# Patient Record
Sex: Female | Born: 1955 | Race: White | Hispanic: No | Marital: Married | Smoking: Former smoker
Health system: Southern US, Community
[De-identification: ages and names within clinical notes are randomized; demographics above are authoritative.]

---

## 2013-07-22 DIAGNOSIS — F32A Depression, unspecified: Secondary | ICD-10-CM | POA: Insufficient documentation

## 2013-07-22 DIAGNOSIS — F3342 Major depressive disorder, recurrent, in full remission: Secondary | ICD-10-CM | POA: Insufficient documentation

## 2013-07-22 DIAGNOSIS — E039 Hypothyroidism, unspecified: Secondary | ICD-10-CM | POA: Insufficient documentation

## 2013-07-23 DIAGNOSIS — N3281 Overactive bladder: Secondary | ICD-10-CM | POA: Insufficient documentation

## 2015-07-30 DIAGNOSIS — E7800 Pure hypercholesterolemia, unspecified: Secondary | ICD-10-CM | POA: Insufficient documentation

## 2016-12-15 DIAGNOSIS — E559 Vitamin D deficiency, unspecified: Secondary | ICD-10-CM | POA: Insufficient documentation

## 2020-08-30 ENCOUNTER — Ambulatory Visit (HOSPITAL_BASED_OUTPATIENT_CLINIC_OR_DEPARTMENT_OTHER)
Admission: RE | Admit: 2020-08-30 | Discharge: 2020-08-30 | Disposition: A | Discharge status: Home / Self Care | Payer: Managed Care, Other (non HMO) | Source: Ambulatory Visit | Attending: Family Medicine | Admitting: Family Medicine

## 2020-08-30 ENCOUNTER — Ambulatory Visit (INDEPENDENT_AMBULATORY_CARE_PROVIDER_SITE_OTHER): Payer: Managed Care, Other (non HMO) | Admitting: Family Medicine

## 2020-08-30 ENCOUNTER — Encounter: Payer: Self-pay | Admitting: Family Medicine

## 2020-08-30 ENCOUNTER — Other Ambulatory Visit: Payer: Self-pay

## 2020-08-30 VITALS — BP 125/81 | HR 79 | Ht 64.0 in | Wt 135.0 lb

## 2020-08-30 DIAGNOSIS — M545 Low back pain, unspecified: Secondary | ICD-10-CM | POA: Insufficient documentation

## 2020-08-30 MED ORDER — PREDNISONE 5 MG PO TABS: ORAL_TABLET | ORAL | 0 refills | Status: DC

## 2020-08-30 NOTE — Patient Instructions (Signed)
Nice to meet you Please try heat  Please try the prednisone  Please try the duexis after the prednisone  Please try the stretches  I will call with the results from today   Please send me a message in [place] with any questions or updates.  Please see me back in 1-2 weeks.   --Dr. Raeford Razor

## 2020-08-30 NOTE — Assessment & Plan Note (Signed)
Pain seems to be related to spasm.  She has a history of fusion from several years ago.  May have a component of SI contribution. -Counseled on home exercise therapy and supportive care. -Prednisone. -Duexis samples. -X-ray -May need to consider SI joint injection physical therapy.

## 2020-08-30 NOTE — Progress Notes (Signed)
Medication Samples have been provided to the patient.  Drug name: Duexis       Strength: 800mg /26.6mg         Qty: 1 box  LOT: [id number]  Exp.Date: 02/2021  Dosing instructions: take 1 tablet by mouth three (3) times a day.  The patient has been instructed regarding the correct time, dose, and frequency of taking this medication, including desired effects and most common side effects.   Sherrie George, [place] 12:01 PM 08/30/2020

## 2020-08-30 NOTE — Progress Notes (Signed)
Brenda Stanley - 64 y.o. female MRN [id number]  Date of birth: Aug 06, 1956  SUBJECTIVE:  Including CC & ROS.  Chief Complaint  Patient presents with  . Back Pain    left-sided low back    Brenda Stanley is a 64 y.o. female that is presenting with acute low back pain.  The pain started yesterday after she sneezed.  Now she is having trouble with getting up from a seated position.  Having some pain in the left gluteal region as well as some pain in the lateral hip.  Has a history of fusion several years ago.  No new or different exposures.  No weakness.  No incontinence.   Review of Systems See HPI   HISTORY: Past Medical, Surgical, Social, and Family History Reviewed & Updated per EMR.   Pertinent Historical Findings include:  History reviewed. No pertinent past medical history.  History reviewed. No pertinent surgical history.  History reviewed. No pertinent family history.  Social History   Socioeconomic History  . Marital status: Married    Spouse name: Not on file  . Number of children: Not on file  . Years of education: Not on file  . Highest education level: Not on file  Occupational History  . Not on file  Tobacco Use  . Smoking status: Never Smoker  . Smokeless tobacco: Never Used  Substance and Sexual Activity  . Alcohol use: Not on file  . Drug use: Not on file  . Sexual activity: Not on file  Other Topics Concern  . Not on file  Social History Narrative  . Not on file   Social Determinants of Health   Financial Resource Strain:   . Difficulty of Paying Living Expenses: Not on file  Food Insecurity:   . Worried About Charity fundraiser in the Last Year: Not on file  . Ran Out of Food in the Last Year: Not on file  Transportation Needs:   . Lack of Transportation (Medical): Not on file  . Lack of Transportation (Non-Medical): Not on file  Physical Activity:   . Days of Exercise per Week: Not on file  . Minutes of Exercise per Session: Not on file  Stress:    . Feeling of Stress : Not on file  Social Connections:   . Frequency of Communication with Friends and Family: Not on file  . Frequency of Social Gatherings with Friends and Family: Not on file  . Attends Religious Services: Not on file  . Active Member of Clubs or Organizations: Not on file  . Attends Archivist Meetings: Not on file  . Marital Status: Not on file  Intimate Partner Violence:   . Fear of Current or Ex-Partner: Not on file  . Emotionally Abused: Not on file  . Physically Abused: Not on file  . Sexually Abused: Not on file     PHYSICAL EXAM:  VS: BP 125/81   Pulse 79   Ht 5\' 4"  (1.626 m)   Wt 135 lb (61.2 kg)   BMI 23.17 kg/m  Physical Exam Gen: NAD, alert, cooperative with exam, well-appearing MSK:  Back: Normal flexion. Pain with extension. Normal abduction. Normal internal and external rotation of the left hip. Normal hip flexion. Some pain with straight leg raise. Neurovascularly intact     ASSESSMENT & PLAN:   Acute left-sided low back pain without sciatica Pain seems to be related to spasm.  She has a history of fusion from several years ago.  May have  a component of SI contribution. -Counseled on home exercise therapy and supportive care. -Prednisone. -Duexis samples. -X-ray -May need to consider SI joint injection physical therapy.

## 2020-08-31 ENCOUNTER — Telehealth: Payer: Self-pay | Admitting: Family Medicine

## 2020-08-31 DIAGNOSIS — M545 Low back pain, unspecified: Secondary | ICD-10-CM

## 2020-08-31 NOTE — Telephone Encounter (Signed)
Patient called back, spoke w/ CMA  xray results shared but she now has some addt'l questions she wants to go over w/Dr.Schmitz.  --Forwarding message to provider Patient wishes him to call her.  --glh

## 2020-08-31 NOTE — Telephone Encounter (Signed)
Left VM for patient. If she calls back please have her speak with a nurse/CMA and inform that her xray shows the degenerative changes and surgical hardware that is broken. This appears more chronic than acute. We can consider a CT to better evaluate all of these changes if the pain isn't improving.   If any questions then please take the best time and phone number to call and I will try to call her back.   Rosemarie Ax, MD [facility] 08/31/2020, 8:34 AM

## 2020-08-31 NOTE — Telephone Encounter (Signed)
Spoke with patient about xray findings. Will proceed with CT lumbar spine to evaluate fractured hardware.   Rosemarie Ax, MD [facility] 08/31/2020, 1:37 PM

## 2020-08-31 NOTE — Telephone Encounter (Signed)
Spoke to patient and gave result information as provided by the physician. 

## 2020-08-31 NOTE — Telephone Encounter (Signed)
Patient returned provider's call for results-- forwarding message to med asst to contact pt w/ Provider review  --glh.

## 2020-09-01 ENCOUNTER — Telehealth: Payer: Self-pay | Admitting: Family Medicine

## 2020-09-01 NOTE — Telephone Encounter (Signed)
Received call from [facility] [phone] says pt has called them for a [place] approval for CT Scan (they advised her that they had our med records but review & approval would take  Up to 72hrs-- they also advised her that if provider did a Peer to Peer that approval time could be greatly reduce to up to 24hrs--  Instructions are that provider call [place] @ [phone] as soon as possible to schedule & from there a decision would be rendered.  --Forwarding information to [place].  --glh

## 2020-09-01 NOTE — Telephone Encounter (Signed)
Rcvd notice from [place] that addt'l medical information needed for pre- approval of CT Scan-- faxed 2nd time  OV notes to [phone]

## 2020-09-02 ENCOUNTER — Telehealth: Payer: Self-pay | Admitting: Family Medicine

## 2020-09-02 ENCOUNTER — Other Ambulatory Visit: Payer: Self-pay

## 2020-09-02 ENCOUNTER — Ambulatory Visit (INDEPENDENT_AMBULATORY_CARE_PROVIDER_SITE_OTHER): Payer: Managed Care, Other (non HMO)

## 2020-09-02 DIAGNOSIS — M545 Low back pain, unspecified: Secondary | ICD-10-CM

## 2020-09-02 NOTE — Telephone Encounter (Signed)
Spoke with patient's husband about the status of the CT scan.  Insurance was called and provided updated imaging of the x-ray.  It is still on the evaluation process.  He reports she is having periods of syncope.  Advised to have follow-up with primary care or seek more immediate care if needed.  Rosemarie Ax, MD [facility] 09/02/2020, 9:39 AM

## 2020-09-02 NOTE — Telephone Encounter (Signed)
[employer] rep/Raymond cld frm [phone] says ( pt/Brenda Stanley) & [employer](PAC comp rep/Nicolas all on line regarding precert for CT Scan for patient.--Per them CT Scan not showing as urgent.(although Dr Raeford Razor did so during call w/ [facility] dept) while giving addt'l medical updates) -- Clarification needed for Urgency of CT scan &  Dr. Raeford Razor connected on line w/ all confirmed that scan is necessary/Urgent  & Per Kyung Rudd it will take 4 hrs for precert to go through process, per him an email will be sent to pt-- a faxed Approval will be sent to provider--a CT Scan appt has been held in reserve for pt @ [facility] dept & they states Auth must be rcvd & documented before scan can be performed.  --Patient will be contacted to show up @ scheduled time once Authorization has been received.  --glh

## 2020-09-06 ENCOUNTER — Telehealth (INDEPENDENT_AMBULATORY_CARE_PROVIDER_SITE_OTHER): Payer: Managed Care, Other (non HMO) | Admitting: Family Medicine

## 2020-09-06 ENCOUNTER — Other Ambulatory Visit: Payer: Self-pay

## 2020-09-06 DIAGNOSIS — M545 Low back pain, unspecified: Secondary | ICD-10-CM | POA: Diagnosis not present

## 2020-09-06 DIAGNOSIS — Z981 Arthrodesis status: Secondary | ICD-10-CM

## 2020-09-06 NOTE — Assessment & Plan Note (Signed)
Unclear of when the fracture occurred.  - referral to neurosurgery

## 2020-09-06 NOTE — Assessment & Plan Note (Signed)
She does have degenerative changes of the facet joints as well as a small slip. -Counseled supportive care. -Referral to physical therapy.

## 2020-09-06 NOTE — Progress Notes (Signed)
Virtual Visit via Telephone Note  I connected with Brenda Stanley on 09/06/20 at  2:30 PM EDT by telephone and verified that I am speaking with the correct person using two identifiers.   I discussed the limitations, risks, security and privacy concerns of performing an evaluation and management service by telephone and the availability of in person appointments. I also discussed with the patient that there may be a patient responsible charge related to this service. The patient expressed understanding and agreed to proceed.  Patient: home  Physician: office   History of Present Illness:  Brenda Stanley is a 64 yo F that is following up after a CT of her lumbar spine. Her pain continues and is worse with transitioning from sitting to standing.    Observations/Objective:  Gen: NAD, alert, cooperative with exam,   Assessment and Plan:  History of lumbar fusion:  Unclear of when the fracture occurred.  - referral to neurosurgery  Low back pain: She does have degenerative changes of the facet joints as well as a small slip. -Counseled supportive care. -Referral to physical therapy.  Follow Up Instructions:    I discussed the assessment and treatment plan with the patient. The patient was provided an opportunity to ask questions and all were answered. The patient agreed with the plan and demonstrated an understanding of the instructions.   The patient was advised to call back or seek an in-person evaluation if the symptoms worsen or if the condition fails to improve as anticipated.  Less than 50 % of the visit was conducted by video.  I provided 7 minutes of non-face-to-face time during this encounter.   Clearance Coots, MD

## 2020-09-08 ENCOUNTER — Telehealth: Payer: Self-pay | Admitting: Family Medicine

## 2020-09-08 DIAGNOSIS — Z4889 Encounter for other specified surgical aftercare: Secondary | ICD-10-CM

## 2020-09-08 NOTE — Telephone Encounter (Signed)
Referring physician requesting myelogram of lumbar spine before initial consult. Order placed.   Rosemarie Ax, MD [facility] 09/08/2020, 11:19 AM

## 2020-09-08 NOTE — Telephone Encounter (Signed)
Doneisha from [place] office) [phone] say since pt has metal hardware in back MRI not necessary a CT Myelogram is okay for pt.  --forwarding message to provider for review.  --glh

## 2020-09-13 ENCOUNTER — Telehealth: Payer: Self-pay | Admitting: Family Medicine

## 2020-09-13 NOTE — Telephone Encounter (Signed)
Office clsd Fri 10/22 @ 12 noon-- Pt left msg after clsing inquired status of Ct Scan ---[facility] clsd  10/25 & 26 .  Responded to pt's VM, unaware issue had been addressed by  [facility] Adm per pt & that they are getting her a Ct scheduled --apologized  office closure delayed our response.  glh

## 2020-09-15 ENCOUNTER — Encounter: Payer: Self-pay | Admitting: *Deleted

## 2020-09-15 ENCOUNTER — Telehealth: Payer: Self-pay | Admitting: Family Medicine

## 2020-09-15 NOTE — Progress Notes (Signed)
Dr Alfred Levins [facility] [address], [place] [address] Phone: [phone] Appt is 11/09/20 @ 3pm  Informed their staff that we wanted patient to be sooner if possible. They agreed to put patient on the waiting list to be moved up for a sooner appt if someone cancels their appt. Informed patient that I knew the nurse practioner that works at [employer] and I will contact her to see if she could help expedite the patients appt date. Pt agreed to all the above information.

## 2020-09-15 NOTE — Telephone Encounter (Signed)
FYI that pt's LOV notes & prior Ct Scan report faxed to [place] @ [phone] for authorization of CT scan.  --glh

## 2020-09-15 NOTE — Progress Notes (Signed)
Patient called back after processing that she could not wait until 12/21 to see someone for a referral. She expressed that she is willing to see a surgeon that we recommend, given her condition, since her choice of surgeon (Dr Alfred Levins) appointment is not until December 21st 2021. After talking with Dr Raeford Razor, Dr Lynann Bologna at [facility] is who we felt could see her the soonest and since we have worked with [place] in the past. Patient now has an appointment to see Dr Phylliss Bob on Friday, November 5th at 10:15a. Since she is a new patient, she is to arrive 45 mins early for this appt. Their address is [address] [place] [address]. The office number is [phone] in case she needs to call for any reason. She is to bring her insurance card, wear a mask, and she is the only one allowed in for the appt due to [place] office restrictions. Informed patient of all of the information regarding the appt. Also told patient since [place] is not on Epic, I will fax her records over to the office at  [phone]. Patient in agreement with the above plan.

## 2020-09-20 ENCOUNTER — Ambulatory Visit: Payer: Managed Care, Other (non HMO) | Admitting: Physical Therapy

## 2020-09-28 ENCOUNTER — Other Ambulatory Visit: Payer: Self-pay | Admitting: Family Medicine

## 2020-09-28 ENCOUNTER — Other Ambulatory Visit: Payer: Self-pay | Admitting: Orthopedic Surgery

## 2020-09-28 DIAGNOSIS — M533 Sacrococcygeal disorders, not elsewhere classified: Secondary | ICD-10-CM

## 2020-09-28 DIAGNOSIS — Z4889 Encounter for other specified surgical aftercare: Secondary | ICD-10-CM

## 2020-10-18 ENCOUNTER — Other Ambulatory Visit: Payer: Self-pay

## 2020-10-18 ENCOUNTER — Ambulatory Visit
Admission: RE | Admit: 2020-10-18 | Discharge: 2020-10-18 | Disposition: A | Discharge status: Home / Self Care | Payer: Managed Care, Other (non HMO) | Source: Ambulatory Visit | Attending: Orthopedic Surgery | Admitting: Orthopedic Surgery

## 2020-10-18 DIAGNOSIS — M533 Sacrococcygeal disorders, not elsewhere classified: Secondary | ICD-10-CM

## 2020-10-18 NOTE — Discharge Instructions (Signed)

## 2020-10-20 ENCOUNTER — Ambulatory Visit (INDEPENDENT_AMBULATORY_CARE_PROVIDER_SITE_OTHER): Payer: Managed Care, Other (non HMO) | Admitting: Family Medicine

## 2020-10-20 ENCOUNTER — Encounter: Payer: Self-pay | Admitting: Family Medicine

## 2020-10-20 VITALS — BP 129/64 | HR 72 | Temp 98.1°F | Ht 64.0 in | Wt 139.4 lb

## 2020-10-20 DIAGNOSIS — N3281 Overactive bladder: Secondary | ICD-10-CM

## 2020-10-20 DIAGNOSIS — E278 Other specified disorders of adrenal gland: Secondary | ICD-10-CM

## 2020-10-20 DIAGNOSIS — N2889 Other specified disorders of kidney and ureter: Secondary | ICD-10-CM | POA: Diagnosis not present

## 2020-10-20 DIAGNOSIS — E7800 Pure hypercholesterolemia, unspecified: Secondary | ICD-10-CM

## 2020-10-20 DIAGNOSIS — E039 Hypothyroidism, unspecified: Secondary | ICD-10-CM | POA: Diagnosis not present

## 2020-10-20 DIAGNOSIS — F3341 Major depressive disorder, recurrent, in partial remission: Secondary | ICD-10-CM

## 2020-10-20 MED ORDER — OXYBUTYNIN CHLORIDE 5 MG PO TABS
5.0000 mg | ORAL_TABLET | Freq: Three times a day (TID) | ORAL | 1 refills | Status: DC | PRN
Start: 2020-10-20 — End: 2024-04-04

## 2020-10-20 MED ORDER — FLUOXETINE HCL 40 MG PO CAPS
ORAL_CAPSULE | ORAL | 1 refills | Status: DC
Start: 2020-10-20 — End: 2021-06-29

## 2020-10-20 MED ORDER — LEVOTHYROXINE SODIUM 50 MCG PO TABS
ORAL_TABLET | ORAL | 1 refills | Status: DC
Start: 2020-10-20 — End: 2021-06-21

## 2020-10-20 MED ORDER — ATORVASTATIN CALCIUM 40 MG PO TABS
ORAL_TABLET | ORAL | 1 refills | Status: DC
Start: 2020-10-20 — End: 2021-04-14

## 2020-10-20 NOTE — Assessment & Plan Note (Signed)
Stable with prozac, she will continue this at current strength.  Refill ordered

## 2020-10-20 NOTE — Progress Notes (Addendum)
Brenda Stanley - 64 y.o. female MRN [id number]  Date of birth: 1956/04/21  Subjective Chief Complaint  Patient presents with  . Establish Care  . Medication Refill    HPI Brenda Stanley is a 64 y.o. female here today for initial visit.  She has history of HLD, hypothyroidism, depression and low back pain.  She is seeing Dr. Lynann Bologna for her back pain, recently had SI injection with anesthetic.  She had recent MRI of her lumbar spine as well and noted to have ~4cm R renal mass vs hemorrhagic cyst and ~1cm adrenal nodule.  She has not noticed hematuria or other symptoms.    She has been on stable dose of levothyroxine at 64mcg for several years.  She feels good at current dose.   Cholesterol has been managed with atorvastatin 40mg  daily.  She is tolerating this well without significant side effects.  It has been about 1 year since she had labs.    She takes oxybutynin as needed for OAB symptoms.  This is working well for her without significant side effects.    Depression screen PHQ 2/9 10/20/2020  Decreased Interest 2  Down, Depressed, Hopeless 1  PHQ - 2 Score 3  Altered sleeping 2  Tired, decreased energy 1  Change in appetite 2  Feeling bad or failure about yourself  1  Trouble concentrating 2  Moving slowly or fidgety/restless 0  Suicidal thoughts 0  PHQ-9 Score 11  Difficult doing work/chores Somewhat difficult   GAD 7 : Generalized Anxiety Score 10/20/2020  Nervous, Anxious, on Edge 0  Control/stop worrying 1  Worry too much - different things 1  Trouble relaxing 1  Restless 0  Easily annoyed or irritable 0  Afraid - awful might happen 0  Total GAD 7 Score 3  Anxiety Difficulty Not difficult at all      ROS:  A comprehensive ROS was completed and negative except as noted per HPI  Allergies  Allergen Reactions  . Sulfa Antibiotics   . Tetracyclines & Related   . Codeine Nausea And Vomiting  . Varenicline Other (See Comments)    Bad dreams Bad dreams     History  reviewed. No pertinent past medical history.  History reviewed. No pertinent surgical history.  Social History   Socioeconomic History  . Marital status: Married    Spouse name: Not on file  . Number of children: Not on file  . Years of education: Not on file  . Highest education level: Not on file  Occupational History  . Occupation: Therapist  Tobacco Use  . Smoking status: Former Smoker    Packs/day: 0.75    Years: 30.00    Pack years: 22.50    Types: Cigarettes  . Smokeless tobacco: Never Used  Vaping Use  . Vaping Use: Never used  Substance and Sexual Activity  . Alcohol use: Yes    Alcohol/week: 1.0 - 2.0 standard drink    Types: 1 - 2 Glasses of wine per week  . Drug use: Not on file  . Sexual activity: Yes    Partners: Male  Other Topics Concern  . Not on file  Social History Narrative  . Not on file   Social Determinants of Health   Financial Resource Strain:   . Difficulty of Paying Living Expenses: Not on file  Food Insecurity:   . Worried About Charity fundraiser in the Last Year: Not on file  . Ran Out of Food in the Last Year:  Not on file  Transportation Needs:   . Lack of Transportation (Medical): Not on file  . Lack of Transportation (Non-Medical): Not on file  Physical Activity:   . Days of Exercise per Week: Not on file  . Minutes of Exercise per Session: Not on file  Stress:   . Feeling of Stress : Not on file  Social Connections:   . Frequency of Communication with Friends and Family: Not on file  . Frequency of Social Gatherings with Friends and Family: Not on file  . Attends Religious Services: Not on file  . Active Member of Clubs or Organizations: Not on file  . Attends Archivist Meetings: Not on file  . Marital Status: Not on file    History reviewed. No pertinent family history.  Health Maintenance  Topic Date Due  . Hepatitis C Screening  Never done  . HIV Screening  Never done  . PAP SMEAR-Modifier  Never done   . MAMMOGRAM  Never done  . COLONOSCOPY  Never done  . COVID-19 Vaccine (1) 11/05/2020 (Originally 02/21/1968)  . INFLUENZA VACCINE  02/17/2021 (Originally 06/20/2020)  . TETANUS/TDAP  10/20/2021 (Originally 02/21/1975)     ----------------------------------------------------------------------------------------------------------------------------------------------------------------------------------------------------------------- Physical Exam BP 129/64 (BP Location: Left Arm, Patient Position: Sitting, Cuff Size: Small)   Pulse 72   Temp 98.1 F (36.7 C) (Oral)   Ht 5\' 4"  (1.626 m)   Wt 139 lb 6.4 oz (63.2 kg)   SpO2 96%   BMI 23.93 kg/m   Physical Exam Constitutional:      Appearance: Normal appearance.  HENT:     Head: Normocephalic and atraumatic.  Eyes:     General: No scleral icterus. Cardiovascular:     Rate and Rhythm: Normal rate and regular rhythm.  Pulmonary:     Effort: Pulmonary effort is normal.     Breath sounds: Normal breath sounds.  Musculoskeletal:     Cervical back: Neck supple.  Skin:    General: Skin is warm and dry.  Neurological:     General: No focal deficit present.     Mental Status: She is alert.  Psychiatric:        Mood and Affect: Mood normal.        Behavior: Behavior normal.     ------------------------------------------------------------------------------------------------------------------------------------------------------------------------------------------------------------------- Assessment and Plan  OAB (overactive bladder) Well controlled with ditropan as needed, will continue at current strength.   Depression Stable with prozac, she will continue this at current strength.  Refill ordered  Pure hypercholesterolemia She is doing well with atorvastatin.  Update lipid panel and LFT's   Acquired hypothyroidism Denies symptoms. Update TSH  Renal mass, left Mass vs hemorrhagic cyst. Also with small adrenal nodule.  renal CT  w/wo contrast ordered.    Meds ordered this encounter  Medications  . atorvastatin (LIPITOR) 40 MG tablet    Sig: atorvastatin 40 mg tablet  Take 1 tablet every day by oral route.    Dispense:  90 tablet    Refill:  1  . FLUoxetine (PROZAC) 40 MG capsule    Sig: Prozac 40 mg capsule  Take 1 capsule every day by oral route.    Dispense:  90 capsule    Refill:  1  . levothyroxine (SYNTHROID) 50 MCG tablet    Sig: levothyroxine 50 mcg tablet  Take 1 tablet every day by oral route.    Dispense:  90 tablet    Refill:  1  . oxybutynin (DITROPAN) 5 MG tablet    Sig:  Take 1 tablet (5 mg total) by mouth 3 (three) times daily as needed.    Dispense:  270 tablet    Refill:  1    No follow-ups on file.    This visit occurred during the SARS-CoV-2 public health emergency.  Safety protocols were in place, including screening questions prior to the visit, additional usage of staff PPE, and extensive cleaning of exam room while observing appropriate contact time as indicated for disinfecting solutions.

## 2020-10-20 NOTE — Assessment & Plan Note (Signed)
She is doing well with atorvastatin.  Update lipid panel and LFT's

## 2020-10-20 NOTE — Assessment & Plan Note (Signed)
Well controlled with ditropan as needed, will continue at current strength.

## 2020-10-20 NOTE — Assessment & Plan Note (Signed)
Denies symptoms. Update TSH

## 2020-10-20 NOTE — Assessment & Plan Note (Signed)
Mass vs hemorrhagic cyst. Also with small adrenal nodule.  renal CT w/wo contrast ordered.

## 2020-10-20 NOTE — Patient Instructions (Signed)
It was very nice to meet you today! Please have labs completed.  You can also stop downstairs and schedule CT scan or they will reach out to get you scheduled.  We'll likely need to get insurance authorization first.  Burnis Medin be in touch with results and recommendations.

## 2020-10-21 LAB — COMPLETE METABOLIC PANEL WITH GFR
AG Ratio: 1.7 (calc) (ref 1.0–2.5)
ALT: 16 U/L (ref 6–29)
AST: 21 U/L (ref 10–35)
Albumin: 4 g/dL (ref 3.6–5.1)
Alkaline phosphatase (APISO): 91 U/L (ref 37–153)
BUN: 14 mg/dL (ref 7–25)
CO2: 30 mmol/L (ref 20–32)
Calcium: 9.4 mg/dL (ref 8.6–10.4)
Chloride: 104 mmol/L (ref 98–110)
Creat: 0.92 mg/dL (ref 0.50–0.99)
GFR, Est African American: 76 mL/min/{1.73_m2} (ref >=60)
GFR, Est Non African American: 66 mL/min/{1.73_m2} (ref >=60)
Globulin: 2.3 g/dL (calc) (ref 1.9–3.7)
Glucose, Bld: 86 mg/dL (ref 65–99)
Potassium: 4 mmol/L (ref 3.5–5.3)
Sodium: 140 mmol/L (ref 135–146)
Total Bilirubin: 0.4 mg/dL (ref 0.2–1.2)
Total Protein: 6.3 g/dL (ref 6.1–8.1)

## 2020-10-21 LAB — LIPID PANEL
Cholesterol: 280 mg/dL — ABNORMAL HIGH (ref <200)
HDL: 54 mg/dL (ref >=50)
LDL Cholesterol (Calc): 199 mg/dL (calc) — ABNORMAL HIGH
Non-HDL Cholesterol (Calc): 226 mg/dL (calc) — ABNORMAL HIGH (ref <130)
Total CHOL/HDL Ratio: 5.2 (calc) — ABNORMAL HIGH (ref <5.0)
Triglycerides: 124 mg/dL (ref <150)

## 2020-10-21 LAB — CBC
HCT: 37.1 % (ref 35.0–45.0)
Hemoglobin: 12.2 g/dL (ref 11.7–15.5)
MCH: 28.8 pg (ref 27.0–33.0)
MCHC: 32.9 g/dL (ref 32.0–36.0)
MCV: 87.5 fL (ref 80.0–100.0)
MPV: 10.2 fL (ref 7.5–12.5)
Platelets: 254 10*3/uL (ref 140–400)
RBC: 4.24 10*6/uL (ref 3.80–5.10)
RDW: 13 % (ref 11.0–15.0)
WBC: 6.5 10*3/uL (ref 3.8–10.8)

## 2020-10-21 LAB — TSH: TSH: 4.9 mIU/L — ABNORMAL HIGH (ref 0.40–4.50)

## 2020-11-05 ENCOUNTER — Ambulatory Visit (INDEPENDENT_AMBULATORY_CARE_PROVIDER_SITE_OTHER): Payer: Managed Care, Other (non HMO)

## 2020-11-05 ENCOUNTER — Other Ambulatory Visit: Payer: Self-pay

## 2020-11-05 DIAGNOSIS — E279 Disorder of adrenal gland, unspecified: Secondary | ICD-10-CM

## 2020-11-05 DIAGNOSIS — N2889 Other specified disorders of kidney and ureter: Secondary | ICD-10-CM | POA: Diagnosis not present

## 2020-11-05 DIAGNOSIS — E278 Other specified disorders of adrenal gland: Secondary | ICD-10-CM

## 2020-11-05 DIAGNOSIS — K8020 Calculus of gallbladder without cholecystitis without obstruction: Secondary | ICD-10-CM

## 2020-11-05 MED ORDER — IOHEXOL 300 MG/ML  SOLN
100.0000 mL | Freq: Once | INTRAMUSCULAR | Status: AC | PRN
  Administered 2020-11-05: 100 mL via INTRAVENOUS

## 2020-11-08 ENCOUNTER — Telehealth: Payer: Self-pay | Admitting: Family Medicine

## 2020-11-08 ENCOUNTER — Other Ambulatory Visit: Payer: Self-pay | Admitting: Family Medicine

## 2020-11-08 DIAGNOSIS — N2889 Other specified disorders of kidney and ureter: Secondary | ICD-10-CM

## 2020-11-08 NOTE — Telephone Encounter (Signed)
Patient called and states she just got her CT results and has a question about it. Pleases call patient and advise

## 2020-11-08 NOTE — Telephone Encounter (Signed)
Opened in error

## 2020-11-11 ENCOUNTER — Telehealth: Payer: Self-pay

## 2020-11-11 NOTE — Telephone Encounter (Signed)
[employer] sent another denial for the MRI. The next step would be a peer to peer. Please choose a date and time and I will schedule the peer to peer.  [phone] Opt - [phone]

## 2020-11-15 NOTE — Telephone Encounter (Signed)
First available P2P set up for 1130 am 11/16/20

## 2020-11-16 NOTE — Telephone Encounter (Signed)
Imaging approved.  Authorization #: [id number]

## 2020-11-22 ENCOUNTER — Other Ambulatory Visit: Payer: Self-pay

## 2020-11-22 ENCOUNTER — Ambulatory Visit: Payer: Managed Care, Other (non HMO)

## 2020-11-22 DIAGNOSIS — N2889 Other specified disorders of kidney and ureter: Secondary | ICD-10-CM

## 2020-11-22 MED ORDER — GADOBUTROL 1 MMOL/ML IV SOLN
6.0000 mL | Freq: Once | INTRAVENOUS | Status: AC | PRN
  Administered 2020-11-22: 7.5 mL via INTRAVENOUS

## 2020-11-23 ENCOUNTER — Other Ambulatory Visit: Payer: Self-pay | Admitting: Family Medicine

## 2020-11-23 DIAGNOSIS — N2889 Other specified disorders of kidney and ureter: Secondary | ICD-10-CM

## 2020-11-29 ENCOUNTER — Other Ambulatory Visit: Payer: Managed Care, Other (non HMO)

## 2021-01-24 DIAGNOSIS — C642 Malignant neoplasm of left kidney, except renal pelvis: Secondary | ICD-10-CM | POA: Insufficient documentation

## 2021-04-14 ENCOUNTER — Other Ambulatory Visit: Payer: Self-pay | Admitting: Family Medicine

## 2021-06-21 ENCOUNTER — Other Ambulatory Visit: Payer: Self-pay | Admitting: Family Medicine

## 2021-06-28 ENCOUNTER — Other Ambulatory Visit: Payer: Self-pay | Admitting: Family Medicine

## 2021-07-01 ENCOUNTER — Other Ambulatory Visit: Payer: Self-pay | Admitting: Family Medicine

## 2021-07-09 ENCOUNTER — Other Ambulatory Visit: Payer: Self-pay | Admitting: Family Medicine

## 2021-07-30 ENCOUNTER — Other Ambulatory Visit: Payer: Self-pay | Admitting: Family Medicine

## 2021-08-02 ENCOUNTER — Telehealth: Payer: Self-pay | Admitting: Family Medicine

## 2021-08-02 ENCOUNTER — Other Ambulatory Visit: Payer: Self-pay

## 2021-08-02 DIAGNOSIS — F3341 Major depressive disorder, recurrent, in partial remission: Secondary | ICD-10-CM

## 2021-08-02 DIAGNOSIS — E7800 Pure hypercholesterolemia, unspecified: Secondary | ICD-10-CM

## 2021-08-02 DIAGNOSIS — E039 Hypothyroidism, unspecified: Secondary | ICD-10-CM

## 2021-08-02 MED ORDER — ATORVASTATIN CALCIUM 40 MG PO TABS: 40.0000 mg | ORAL_TABLET | Freq: Every day | ORAL | 0 refills | Status: DC

## 2021-08-02 MED ORDER — LEVOTHYROXINE SODIUM 50 MCG PO TABS: 50.0000 ug | ORAL_TABLET | Freq: Every day | ORAL | 0 refills | Status: DC

## 2021-08-02 MED ORDER — FLUOXETINE HCL 40 MG PO CAPS: 40.0000 mg | ORAL_CAPSULE | Freq: Every day | ORAL | 0 refills | Status: DC

## 2021-08-02 NOTE — Telephone Encounter (Signed)
Pt called and scheduled appt. For medication refills. She stated that she is completely out of all meds. She wanted to know if enough could be called in at least until her appt?

## 2021-08-08 ENCOUNTER — Encounter: Payer: Self-pay | Admitting: Family Medicine

## 2021-08-08 ENCOUNTER — Other Ambulatory Visit: Payer: Self-pay

## 2021-08-08 ENCOUNTER — Ambulatory Visit: Payer: Managed Care, Other (non HMO) | Admitting: Family Medicine

## 2021-08-08 VITALS — BP 150/77 | HR 72 | Temp 97.4°F | Ht 64.0 in | Wt 134.7 lb

## 2021-08-08 DIAGNOSIS — E7800 Pure hypercholesterolemia, unspecified: Secondary | ICD-10-CM

## 2021-08-08 DIAGNOSIS — E039 Hypothyroidism, unspecified: Secondary | ICD-10-CM

## 2021-08-08 DIAGNOSIS — C642 Malignant neoplasm of left kidney, except renal pelvis: Secondary | ICD-10-CM

## 2021-08-08 DIAGNOSIS — Z85528 Personal history of other malignant neoplasm of kidney: Secondary | ICD-10-CM | POA: Diagnosis not present

## 2021-08-08 DIAGNOSIS — Z1211 Encounter for screening for malignant neoplasm of colon: Secondary | ICD-10-CM

## 2021-08-08 DIAGNOSIS — F3341 Major depressive disorder, recurrent, in partial remission: Secondary | ICD-10-CM

## 2021-08-08 DIAGNOSIS — F3342 Major depressive disorder, recurrent, in full remission: Secondary | ICD-10-CM

## 2021-08-08 NOTE — Patient Instructions (Signed)
  Brenda Stanley ,     This is a list of the screening recommended for you and due dates:  Health Maintenance  Topic Date Due   COVID-19 Vaccine (1) Never done   HIV Screening  Never done   Hepatitis C Screening: USPSTF Recommendation to screen - Ages 1-65 yo.  Never done   Zoster (Shingles) Vaccine (1 of 2) Never done   Pap Smear  Never done   Mammogram  Never done   Cologuard (Stool DNA test)  Never done   DEXA scan (bone density measurement)  Never done   Flu Shot  06/20/2021   Tetanus Vaccine  10/20/2021*   HPV Vaccine  Aged Out  *Topic was postponed. The date shown is not the original due date.

## 2021-08-09 LAB — COMPLETE METABOLIC PANEL WITH GFR
AG Ratio: 2 (calc) (ref 1.0–2.5)
ALT: 17 U/L (ref 6–29)
AST: 21 U/L (ref 10–35)
Albumin: 4.6 g/dL (ref 3.6–5.1)
Alkaline phosphatase (APISO): 103 U/L (ref 37–153)
BUN: 17 mg/dL (ref 7–25)
CO2: 29 mmol/L (ref 20–32)
Calcium: 9.9 mg/dL (ref 8.6–10.4)
Chloride: 104 mmol/L (ref 98–110)
Creat: 0.96 mg/dL (ref 0.50–1.05)
Globulin: 2.3 g/dL (calc) (ref 1.9–3.7)
Glucose, Bld: 85 mg/dL (ref 65–99)
Potassium: 4.2 mmol/L (ref 3.5–5.3)
Sodium: 140 mmol/L (ref 135–146)
Total Bilirubin: 0.4 mg/dL (ref 0.2–1.2)
Total Protein: 6.9 g/dL (ref 6.1–8.1)
eGFR: 66 mL/min/{1.73_m2} (ref >=60)

## 2021-08-09 LAB — CBC WITH DIFFERENTIAL/PLATELET
Absolute Monocytes: 510 cells/uL (ref 200–950)
Basophils Absolute: 41 cells/uL (ref 0–200)
Basophils Relative: 0.6 %
Eosinophils Absolute: 61 cells/uL (ref 15–500)
Eosinophils Relative: 0.9 %
HCT: 36.7 % (ref 35.0–45.0)
Hemoglobin: 11.7 g/dL (ref 11.7–15.5)
Lymphs Abs: 2149 cells/uL (ref 850–3900)
MCH: 28.4 pg (ref 27.0–33.0)
MCHC: 31.9 g/dL — ABNORMAL LOW (ref 32.0–36.0)
MCV: 89.1 fL (ref 80.0–100.0)
MPV: 10.7 fL (ref 7.5–12.5)
Monocytes Relative: 7.5 %
Neutro Abs: 4039 cells/uL (ref 1500–7800)
Neutrophils Relative %: 59.4 %
Platelets: 231 10*3/uL (ref 140–400)
RBC: 4.12 10*6/uL (ref 3.80–5.10)
RDW: 13.5 % (ref 11.0–15.0)
Total Lymphocyte: 31.6 %
WBC: 6.8 10*3/uL (ref 3.8–10.8)

## 2021-08-09 LAB — TSH: TSH: 2.35 mIU/L (ref 0.40–4.50)

## 2021-08-09 LAB — LIPID PANEL W/REFLEX DIRECT LDL
Cholesterol: 177 mg/dL (ref <200)
HDL: 56 mg/dL (ref >=50)
LDL Cholesterol (Calc): 100 mg/dL (calc) — ABNORMAL HIGH
Non-HDL Cholesterol (Calc): 121 mg/dL (calc) (ref <130)
Total CHOL/HDL Ratio: 3.2 (calc) (ref <5.0)
Triglycerides: 113 mg/dL (ref <150)

## 2021-08-14 MED ORDER — FLUOXETINE HCL 40 MG PO CAPS: 40.0000 mg | ORAL_CAPSULE | Freq: Every day | ORAL | 2 refills | Status: DC

## 2021-08-14 MED ORDER — ATORVASTATIN CALCIUM 40 MG PO TABS: 40.0000 mg | ORAL_TABLET | Freq: Every day | ORAL | 2 refills | Status: DC

## 2021-08-14 MED ORDER — LEVOTHYROXINE SODIUM 50 MCG PO TABS: 50.0000 ug | ORAL_TABLET | Freq: Every day | ORAL | 2 refills | Status: DC

## 2021-08-14 NOTE — Assessment & Plan Note (Signed)
She continues to do well with Prozac.  She will like to continue this for now.

## 2021-08-14 NOTE — Assessment & Plan Note (Signed)
Update TSH

## 2021-08-14 NOTE — Progress Notes (Signed)
Analycia Stanley - 65 y.o. female MRN [id number]  Date of birth: 1956-01-16  Subjective Chief Complaint  Patient presents with   Medication Refill    HPI Brenda Stanley Is a 65 year old female here today for follow-up visit.  Since her last visit with me she had left partial nephrectomy for renal mass.  This turned out to be renal cell carcinoma.  Margins negative.  She is recovered well since her surgery.  She continues on levothyroxine daily.  Feels good at current dose of levothyroxine.  She continues to do well with atorvastatin.  She is due for updated lipid panel.  No myalgias noted.  Depression stable with fluoxetine.  ROS:  A comprehensive ROS was completed and negative except as noted per HPI  Allergies  Allergen Reactions   Sulfa Antibiotics    Tetracyclines & Related    Codeine Nausea And Vomiting   Varenicline Other (See Comments)    Bad dreams Bad dreams     History reviewed. No pertinent past medical history.  History reviewed. No pertinent surgical history.  Social History   Socioeconomic History   Marital status: Married    Spouse name: Not on file   Number of children: Not on file   Years of education: Not on file   Highest education level: Not on file  Occupational History   Occupation: Therapist  Tobacco Use   Smoking status: Former    Packs/day: 0.75    Years: 30.00    Pack years: 22.50    Types: Cigarettes   Smokeless tobacco: Never  Vaping Use   Vaping Use: Never used  Substance and Sexual Activity   Alcohol use: Yes    Alcohol/week: 1.0 - 2.0 standard drink    Types: 1 - 2 Glasses of wine per week   Drug use: Not on file   Sexual activity: Yes    Partners: Male  Other Topics Concern   Not on file  Social History Narrative   Not on file   Social Determinants of Health   Financial Resource Strain: Not on file  Food Insecurity: Not on file  Transportation Needs: Not on file  Physical Activity: Not on file  Stress: Not on file  Social  Connections: Not on file    History reviewed. No pertinent family history.  Health Maintenance  Topic Date Due   COVID-19 Vaccine (1) Never done   HIV Screening  Never done   Hepatitis C Screening  Never done   Zoster Vaccines- Shingrix (1 of 2) Never done   PAP SMEAR-Modifier  Never done   MAMMOGRAM  Never done   Fecal DNA (Cologuard)  Never done   DEXA SCAN  Never done   INFLUENZA VACCINE  06/20/2021   TETANUS/TDAP  10/20/2021 (Originally 02/21/1975)   HPV VACCINES  Aged Out     ----------------------------------------------------------------------------------------------------------------------------------------------------------------------------------------------------------------- Physical Exam BP (!) 150/77 (BP Location: Left Arm, Patient Position: Sitting, Cuff Size: Small)   Pulse 72   Temp (!) 97.4 F (36.3 C)   Ht 5\' 4"  (1.626 m)   Wt 134 lb 11.2 oz (61.1 kg)   SpO2 99%   BMI 23.12 kg/m   Physical Exam Constitutional:      Appearance: Normal appearance.  Eyes:     General: No scleral icterus. Cardiovascular:     Rate and Rhythm: Normal rate and regular rhythm.  Pulmonary:     Effort: Pulmonary effort is normal.     Breath sounds: Normal breath sounds.  Musculoskeletal:  Cervical back: Normal range of motion and neck supple.  Neurological:     General: No focal deficit present.     Mental Status: She is alert.  Psychiatric:        Mood and Affect: Mood normal.        Behavior: Behavior normal.    ------------------------------------------------------------------------------------------------------------------------------------------------------------------------------------------------------------------- Assessment and Plan  Acquired hypothyroidism Update TSH.  Renal cell carcinoma of left kidney (HCC) Status post partial nephrectomy.  Renal cell carcinoma with negative margins on pathology.  Recurrent major depressive disorder, in full  remission ([place]) She continues to do well with Prozac.  She will like to continue this for now.  Pure hypercholesterolemia She continues to do well with atorvastatin.  Update lipid panel.   No orders of the defined types were placed in this encounter.   No follow-ups on file.    This visit occurred during the SARS-CoV-2 public health emergency.  Safety protocols were in place, including screening questions prior to the visit, additional usage of staff PPE, and extensive cleaning of exam room while observing appropriate contact time as indicated for disinfecting solutions.

## 2021-08-14 NOTE — Assessment & Plan Note (Addendum)
Status post partial nephrectomy.  Renal cell carcinoma with negative margins on pathology.

## 2021-08-14 NOTE — Assessment & Plan Note (Signed)
She continues to do well with atorvastatin.  Update lipid panel.

## 2021-09-19 ENCOUNTER — Other Ambulatory Visit: Payer: Self-pay

## 2021-09-19 DIAGNOSIS — E039 Hypothyroidism, unspecified: Secondary | ICD-10-CM

## 2021-09-19 MED ORDER — LEVOTHYROXINE SODIUM 50 MCG PO TABS: 50.0000 ug | ORAL_TABLET | Freq: Every day | ORAL | 2 refills | Status: DC

## 2021-11-04 ENCOUNTER — Telehealth: Payer: Self-pay

## 2021-11-04 NOTE — Telephone Encounter (Signed)
Contacted pt concerning incomplete Cologuard.   Pt states she is unsure whether the testing was received or not. She wants to wait until after the Christmas Holidays to address the issue.

## 2021-12-06 IMAGING — DX DG LUMBAR SPINE 2-3V
3 series · 3 of 3 positions shown · non-contrast
Comparison: None.

CLINICAL DATA: Low back pain, fall

EXAM:
LUMBAR SPINE - 2-3 VIEW

[l-spine lat]
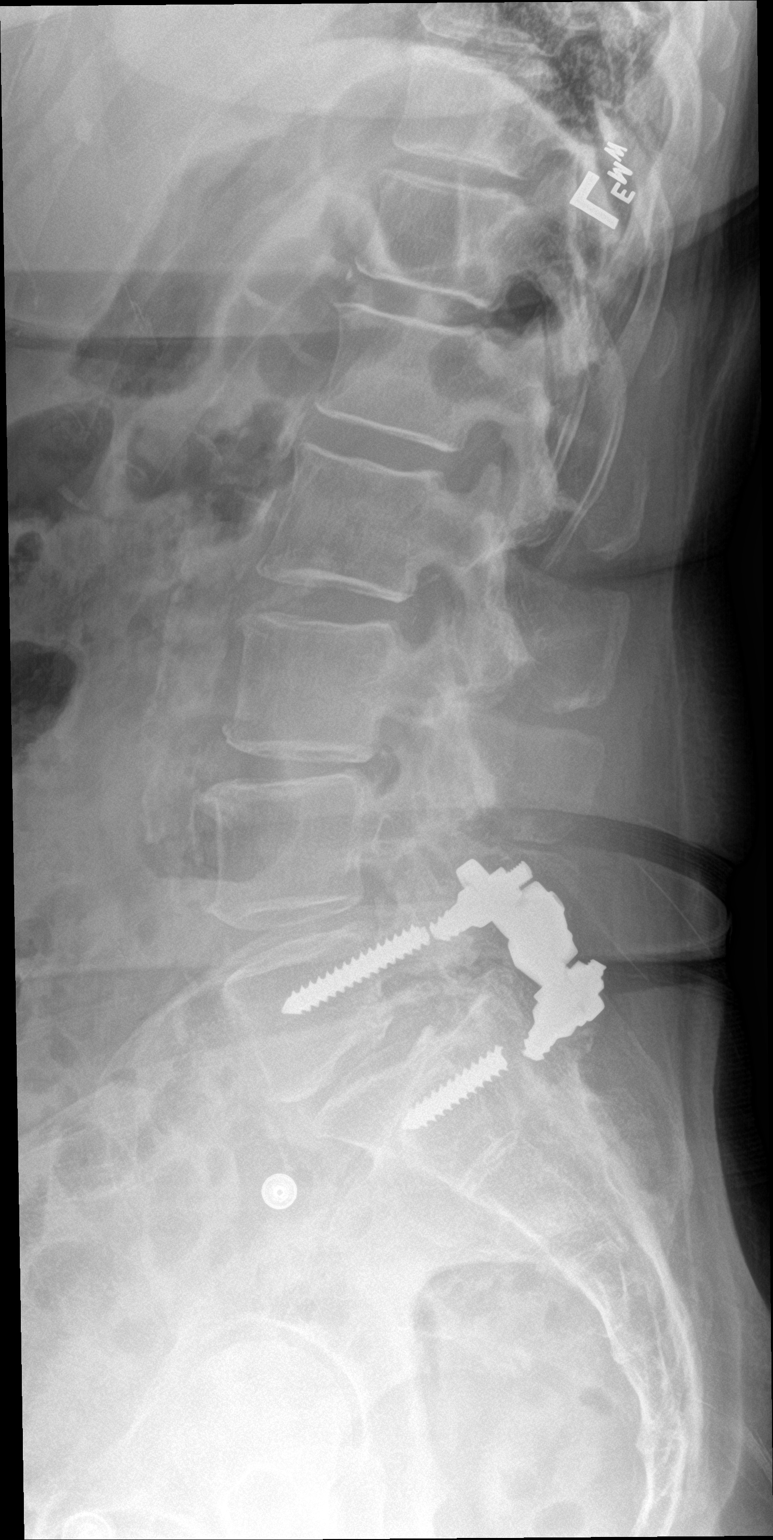

[l-spine spot]
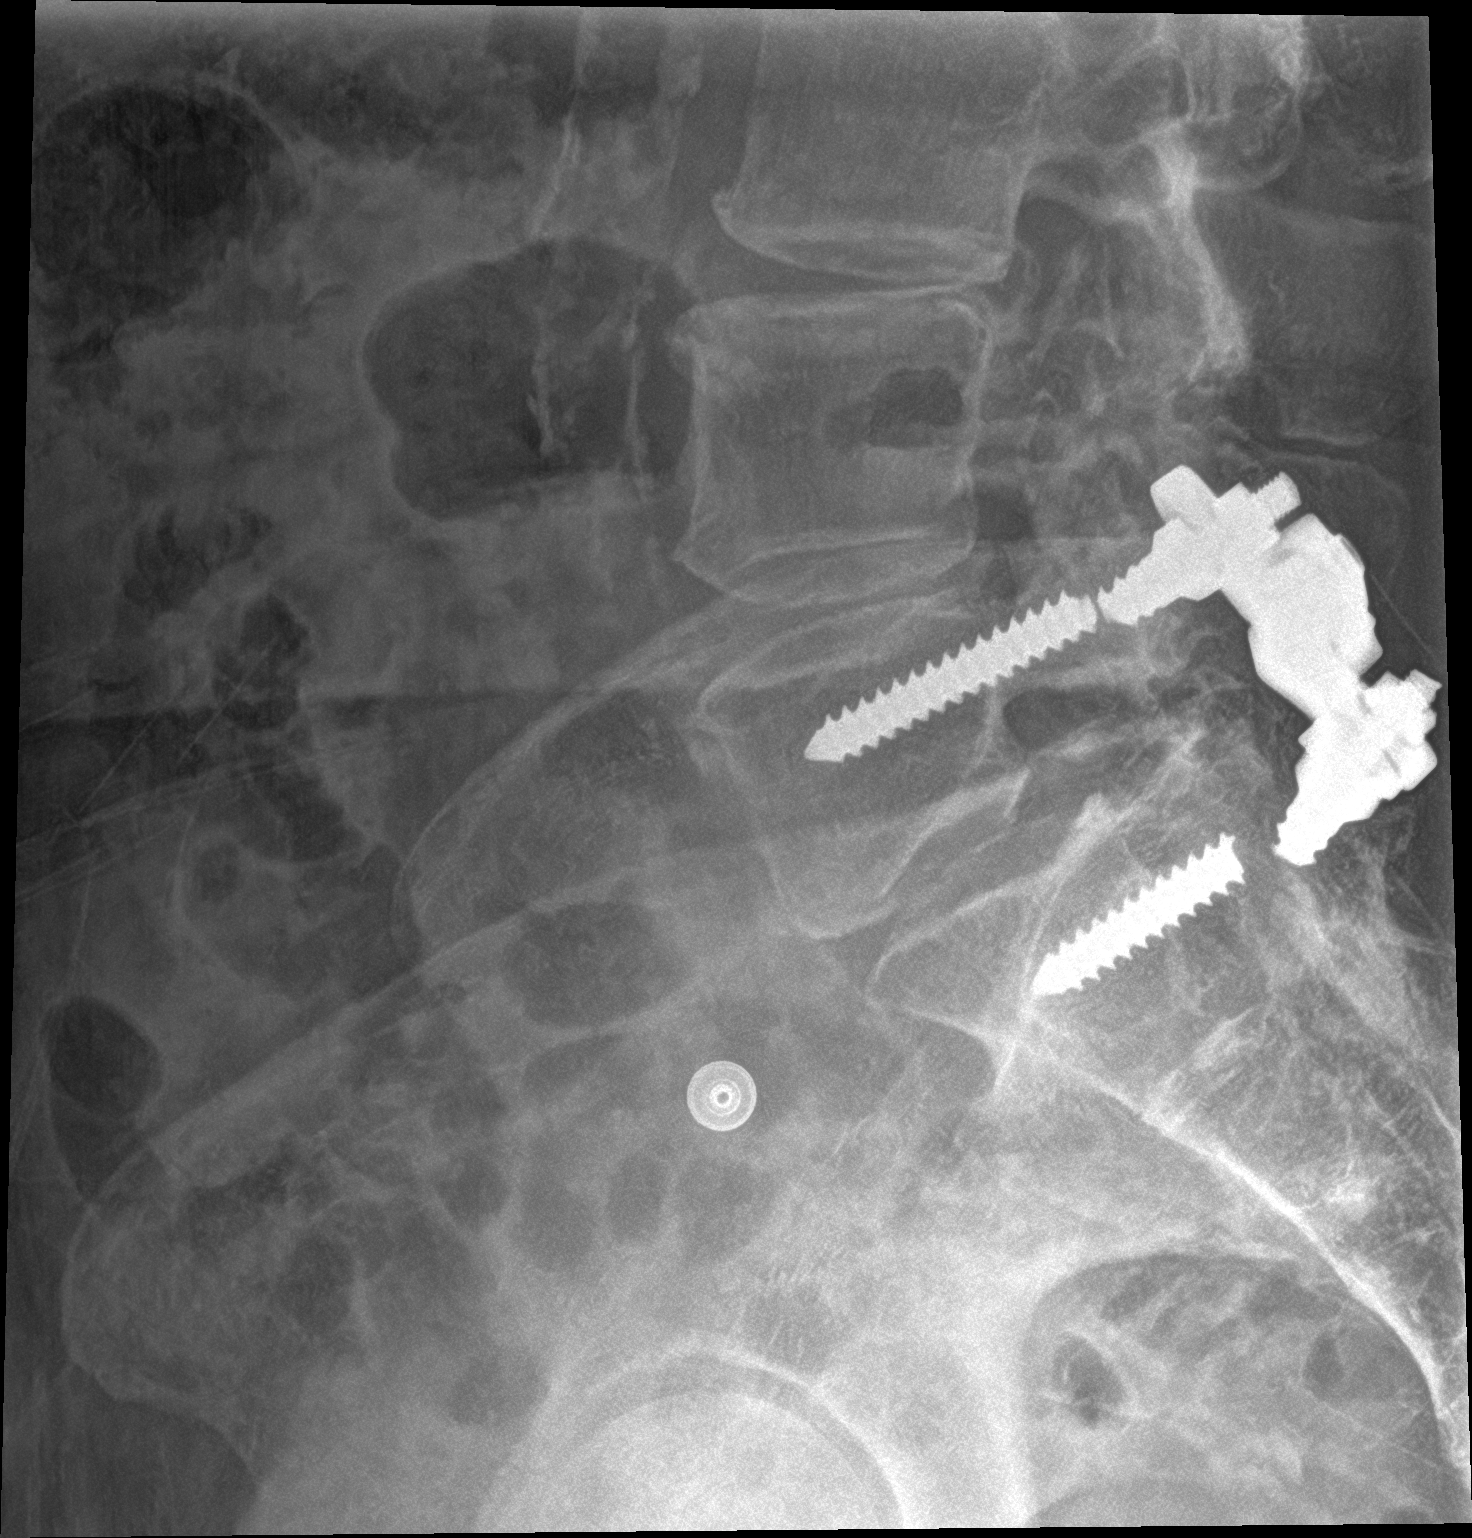

[l-spine ap]
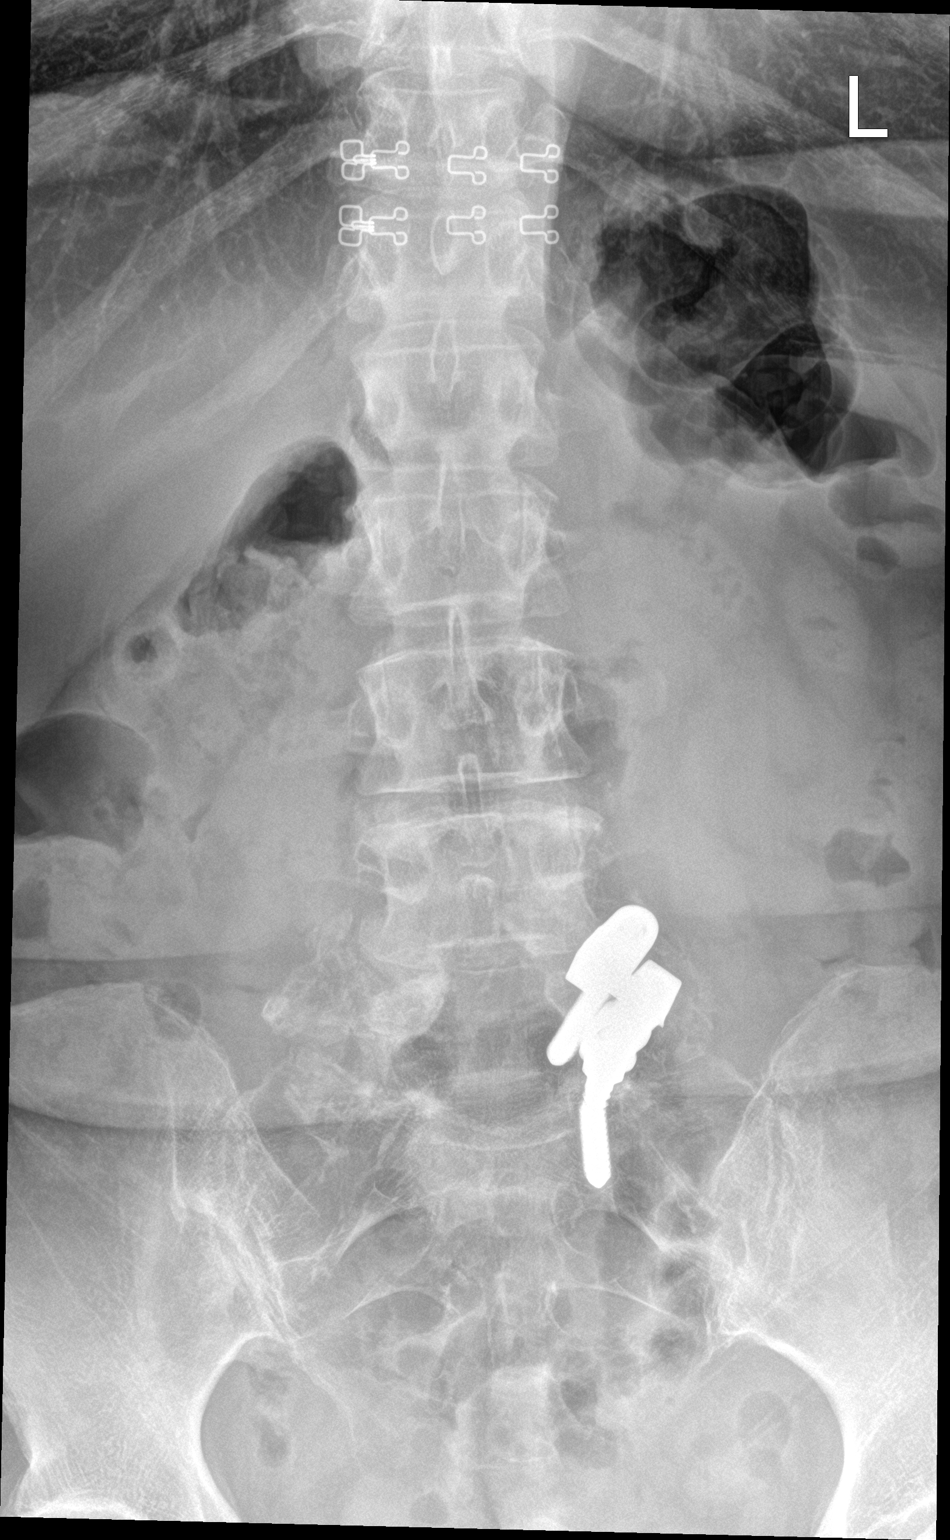

[3 of 3 positions shown; findings below may reference images not displayed]

FINDINGS: Two view radiograph of the lumbar spine demonstrates 5 non rib
bearing segments of the lumbar spine. There is normal lumbar
lordosis.

L5-S1 posterior lumbar fusion with unilateral instrumentation is
seen with left pedicle screws at L5 and S1 as well as a posterior
bar. The pedicle screws are both fractured. Lucency in the region of
the pars articularis suggests an underlying pars defect. There is
grade 1 anterolisthesis of L5 upon S1 with intervertebral disc space
narrowing in keeping with at least mild degenerative disc disease at
this level. Exuberant heterotopic ossification lateral to the right
L5-S1 facet may represent callus related to lumbar fusion or
posttraumatic/postsurgical heterotopic ossification. Resection of
the spinous process of L5 with posterior decompression has been
performed.

There is no acute fracture of the lumbar spine. Vertebral body
height has been preserved. There is intervertebral disc space
narrowing at L3-4 and L4-5 in keeping with changes of mild
degenerative disc disease. Atherosclerotic calcification is seen
within the abdominal aorta.
IMPRESSION: Posterior lumbar fusion with instrumentation and resection of the L5
spinous process with unilateral left-sided instrumentation. Pedicle
screws both at L5 and S1 are fractured. Probable callus within the
right paraspinal soft tissues at L5-S1. Possible pars defect with
grade 1 anterolisthesis of L5 upon S1. These changes, as well as
fusion of the posterior elements, would be better assessed with CT
examination.

No acute fracture or traumatic listhesis of the lumbar spine.

## 2022-02-28 IMAGING — MR MR ABDOMEN WO/W CM
17 of 18 series · 44 of 48 positions shown · IV contrast (GADAVIST)
Comparison: CT on 11/15/2020

CLINICAL DATA: Renal mass and adrenal masses on recent CT.

EXAM:
MRI ABDOMEN WITHOUT AND WITH CONTRAST
TECHNIQUE: Multiplanar multisequence MR imaging of the abdomen was performed
both before and after the administration of intravenous contrast.
CONTRAST:  7.5mL GADAVIST GADOBUTROL 1 MMOL/ML IV SOLN

[Series 3: cor ssfse / · coronal · 7.0mm · 1.25mm/px · 1 of 32 slices shown]
[im 1/32]
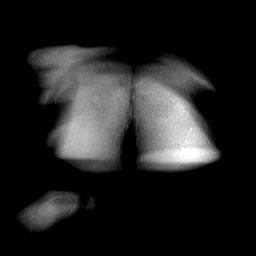

[Series 6: T2 · axial · 6.0mm · 1.41mm/px · 1 of 40 slices shown]
[im 1/40]
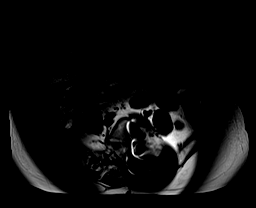

[Series 10: T1 · axial · 6.0mm · 0.70mm/px · z∈[-174,+107]mm · 2 of 80 slices shown (1 of 2)]
[im 1/80]
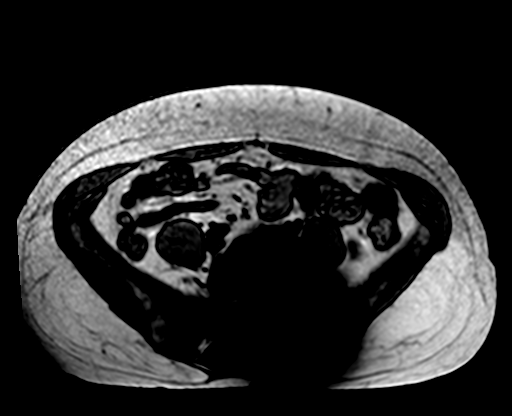
[im 80/80]
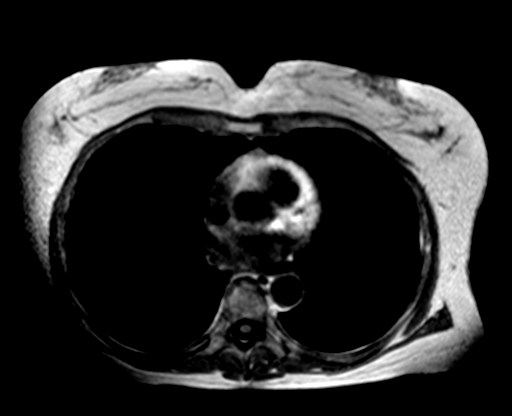

[Series 12: DWI · axial · 6.0mm · 2.00mm/px · z∈[-160,+121]mm · 5 of 120 slices shown (1 of 2)]
[im 1/120]
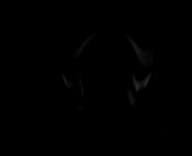
[im 30/120]
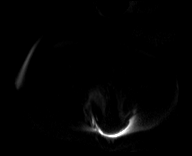
[im 60/120]
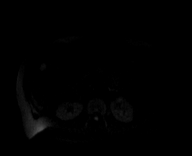
[im 90/120]
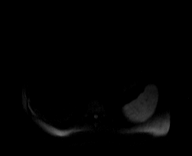
[im 120/120]
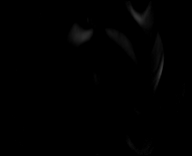

[Series 13: DWI · axial · 6.0mm · 2.00mm/px · z∈[-160,+121]mm · 2 of 40 slices shown (2 of 2)]
[im 1/40]
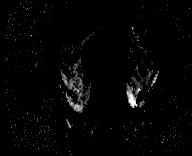
[im 40/40]
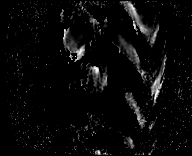

[Series 14: T1 · coronal · 6.0mm · 0.74mm/px · 2 of 58 slices shown (2 of 2)]
[im 1/58]
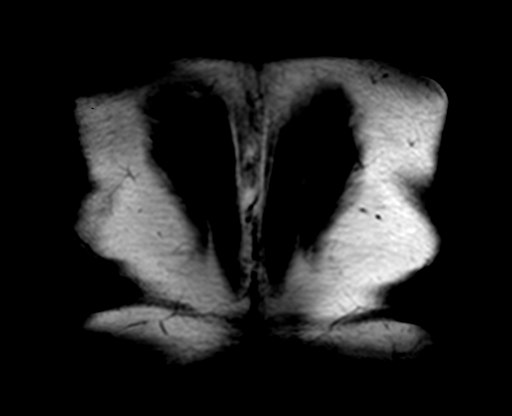
[im 58/58]
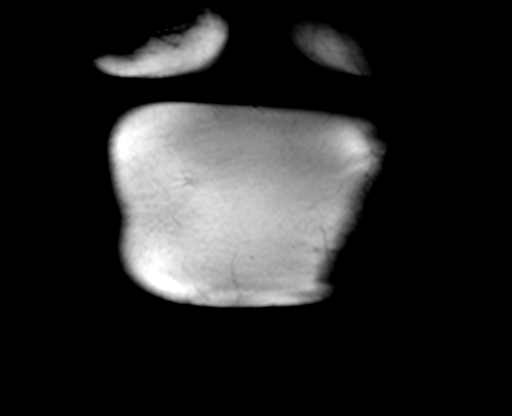

[Series 17: bSSFP · axial · 6.0mm · 0.74mm/px · z∈[-174,+107]mm · 2 of 40 slices shown]
[im 1/40]
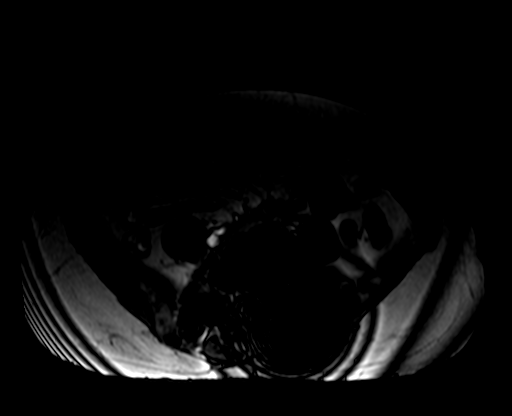
[im 40/40]
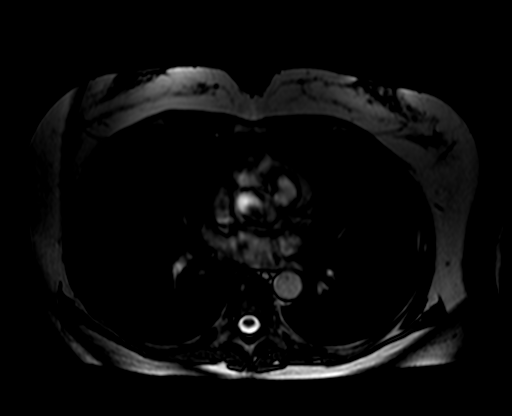

[Series 20: axial ssfse / · axial · 6.0mm · 1.19mm/px · z∈[-174,+107]mm · 2 of 40 slices shown]
[im 1/40]
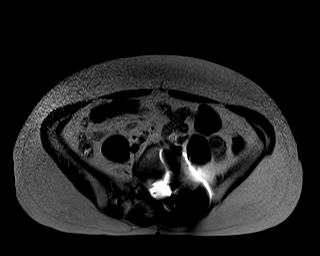
[im 40/40]
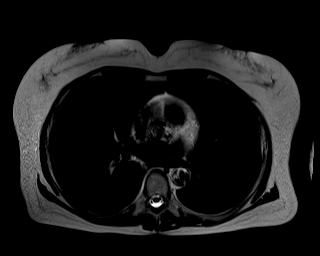

[Series 23: axial dynamic pre · axial · non-contrast · 4.0mm · 1.25mm/px · z∈[-146,+106]mm · 3 of 64 slices shown]
[im 1/64]
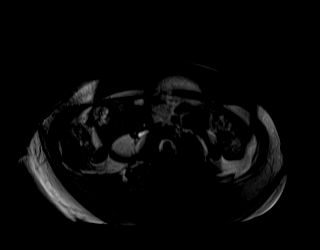
[im 32/64]
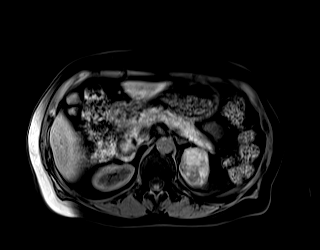
[im 64/64]
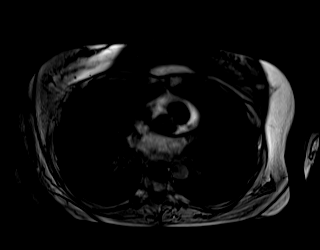

[Series 25: axial dynamic post · axial · 4.0mm · 1.25mm/px · z∈[-146,+106]mm · 3 of 64 slices shown (1 of 6)]
[im 1/64]
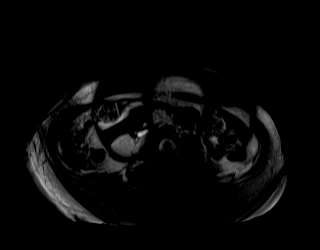
[im 32/64]
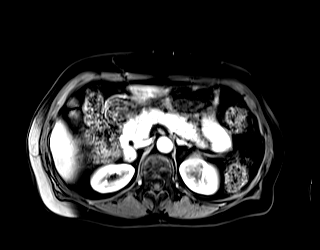
[im 64/64]
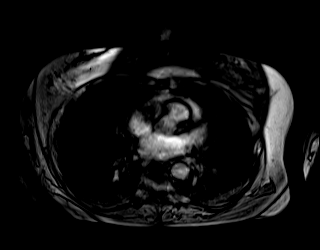

[Series 26: axial dynamic post · axial · 4.0mm · 1.25mm/px · z∈[-146,+106]mm · 3 of 64 slices shown (2 of 6)]
[im 1/64]
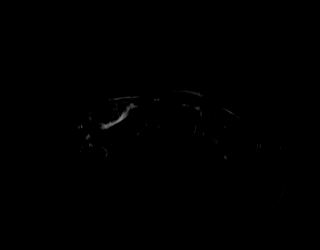
[im 32/64]
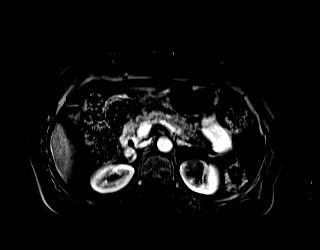
[im 64/64]
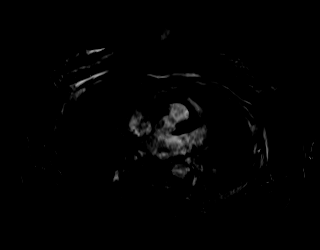

[Series 27: axial dynamic post · axial · 4.0mm · 1.25mm/px · z∈[-146,+106]mm · 3 of 64 slices shown (3 of 6)]
[im 1/64]
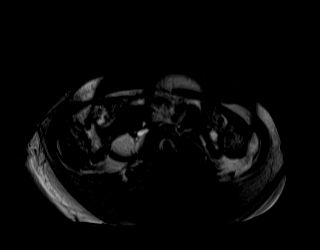
[im 32/64]
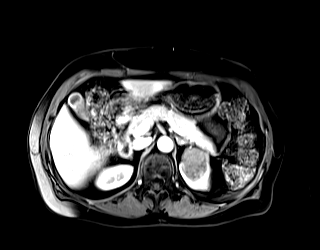
[im 64/64]
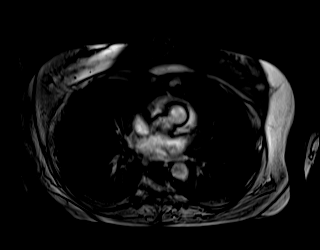

[Series 28: axial dynamic post · axial · 4.0mm · 1.25mm/px · z∈[-146,+106]mm · 3 of 64 slices shown (4 of 6)]
[im 1/64]
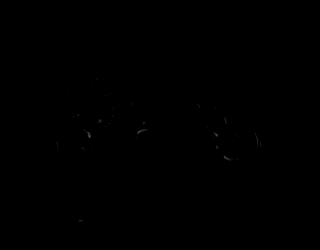
[im 32/64]
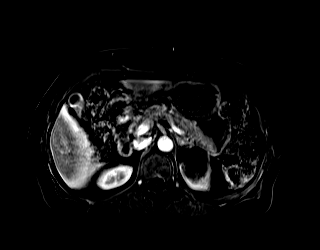
[im 64/64]
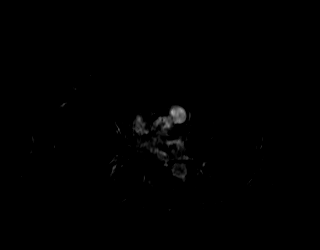

[Series 29: axial dynamic post · axial · 4.0mm · 1.25mm/px · z∈[-146,+106]mm · 3 of 64 slices shown (5 of 6)]
[im 1/64]
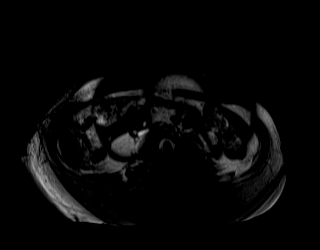
[im 32/64]
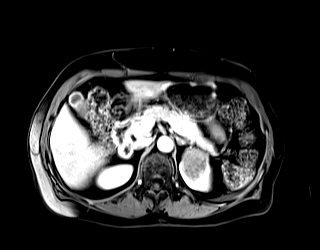
[im 64/64]
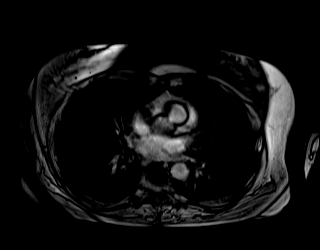

[Series 30: axial dynamic post · axial · 4.0mm · 1.25mm/px · z∈[-146,+106]mm · 3 of 64 slices shown (6 of 6)]
[im 1/64]
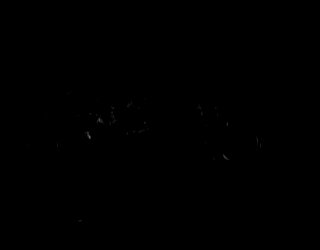
[im 32/64]
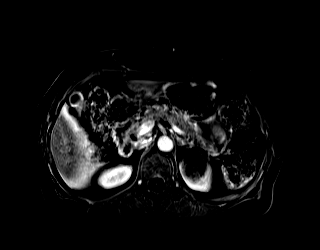
[im 64/64]
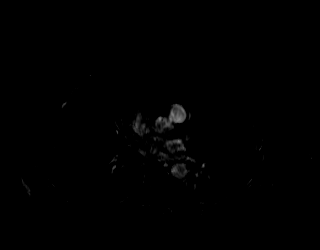

[Series 32: axial dynamic 3 · axial · 4.0mm · 1.25mm/px · z∈[-146,+106]mm · 3 of 64 slices shown (1 of 2)]
[im 1/64]
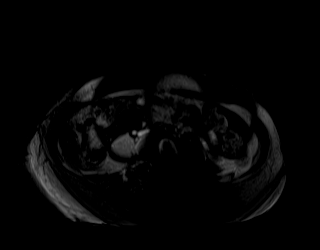
[im 32/64]
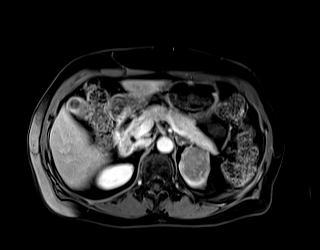
[im 64/64]
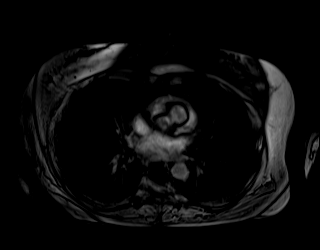

[Series 33: axial dynamic 3 · axial · 4.0mm · 1.25mm/px · z∈[-146,+106]mm · 3 of 64 slices shown (2 of 2)]
[im 1/64]
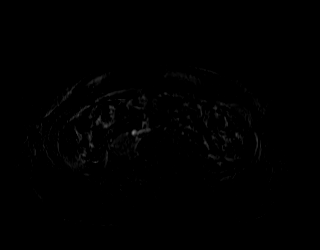
[im 32/64]
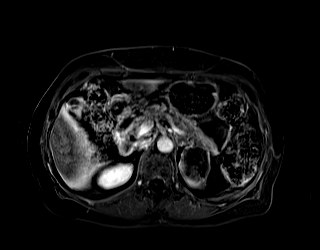
[im 64/64]
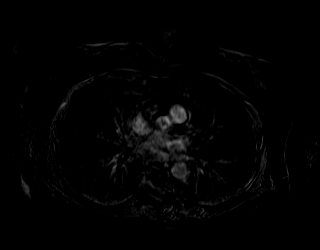

[44 of 48 positions shown; findings below may reference images not displayed]

FINDINGS: Lower chest: No acute findings.

Hepatobiliary: No hepatic masses identified. A 3 cm gallstone is
seen in the gallbladder fundus, however there is no evidence of
cholecystitis or biliary ductal dilatation.

Pancreas:  No mass or inflammatory changes.

Spleen:  Within normal limits in size and appearance.

Adrenals/Urinary Tract: Small bilateral adrenal nodules both show
signal dropout on chemical shift imaging, consistent with benign
adenomas.

The right kidney is normal in appearance. A heterogeneous mass is
seen in the upper pole of the left kidney which shows evidence of
mild internal contrast enhancement on subtraction. This measures
x 3.7 cm on image 33/23, without significant change in size compared
to recent CT. This is suspicious for renal cell carcinoma.

Stomach/Bowel: Visualized portion unremarkable.

Vascular/Lymphatic: No pathologically enlarged lymph nodes
identified. No abdominal aortic aneurysm. No evidence of renal vein
or IVC thrombus.

Other:  None.

Musculoskeletal:  No suspicious bone lesions identified.
IMPRESSION: 5 cm heterogeneous mass in upper pole of left kidney shows mild
heterogeneous enhancement on subtraction imaging, suspicious for
renal cell carcinoma.

No evidence of abdominal metastatic disease.

Small benign bilateral adrenal adenomas.

Cholelithiasis. No radiographic evidence of cholecystitis or biliary
ductal dilatation.

## 2022-03-02 ENCOUNTER — Encounter: Payer: Self-pay | Admitting: Family Medicine

## 2022-03-02 ENCOUNTER — Ambulatory Visit: Payer: Managed Care, Other (non HMO) | Admitting: Family Medicine

## 2022-03-02 VITALS — BP 113/54 | HR 72 | Ht 64.0 in | Wt 136.0 lb

## 2022-03-02 DIAGNOSIS — R42 Dizziness and giddiness: Secondary | ICD-10-CM

## 2022-03-02 DIAGNOSIS — R5383 Other fatigue: Secondary | ICD-10-CM

## 2022-03-02 DIAGNOSIS — R519 Headache, unspecified: Secondary | ICD-10-CM | POA: Diagnosis not present

## 2022-03-02 DIAGNOSIS — E039 Hypothyroidism, unspecified: Secondary | ICD-10-CM | POA: Diagnosis not present

## 2022-03-02 DIAGNOSIS — F3341 Major depressive disorder, recurrent, in partial remission: Secondary | ICD-10-CM

## 2022-03-02 DIAGNOSIS — G8929 Other chronic pain: Secondary | ICD-10-CM

## 2022-03-02 MED ORDER — PREDNISONE 10 MG PO TABS: ORAL_TABLET | ORAL | 0 refills | Status: DC

## 2022-03-02 NOTE — Progress Notes (Signed)
Pt stated that she sometimes experiences some dizziness,headaches that mostly affecting her L side she states that it feels mostly like pressure from sinuses and the pressure moves forward that has been occurring x 2-3 weeks she takes allergy medication for this which seems to help for some. She also stated that she has felt tired,lethargic,cold,stressed ,noticed that her brows are thinning. Her last TSH was done 08/08/21 it was 2.35 ?

## 2022-03-02 NOTE — Assessment & Plan Note (Signed)
Also struggling with some irritability, apathy and fatigue which could all be related.  Discussed the option of increasing her fluoxetine to 60 mg for at least a period of time maybe 2 to 3 months to see if it is helpful.  If she is stable at some point in she could always consider going back down to 40 mg we will get up-to-date labs first and then decide if we need to make an adjustment on the medication. ?

## 2022-03-02 NOTE — Progress Notes (Signed)
? ?Acute Office Visit ? ?Subjective:  ? ? Patient ID: Brenda Stanley, female    DOB: 1956-02-02, 66 y.o.   MRN: [id number] ? ?No chief complaint on file. ? ? ?HPI ?Patient is in today for Pt stated that she sometimes experiences some dizziness,headaches that mostly affecting her L side.  The headaches have been pretty persistent daily for the last 1 to 2 weeks.  No fevers or chills or significant sinus drainage though she does have some chronic congestion.  She states that it feels mostly like pressure from sinuses and the pressure moves forward that has been occurring x 2-3 weeks she takes allergy medication for this which seems to help for some. She also stated that she has felt tired,lethargic,cold,stressed ,noticed that her brows are thinning. Her last TSH was done 08/08/21 it was 2.35.  ? ?She feels she is really stressed.  PHQ 9 score of 8 and GAD 7 of 4.  Taking 40 mg of fluoxetine.  She feels like she is just been off and very irritable and lackluster.  Its been hard to get motivated she says she has been feeling this way for several months.  She has been on fluoxetine for a couple of decades and has been on 40 mg for quite some time.  She had been checking her blood pressures at home and they have been quite high running in the 150s over 110s in general. ? ?She reports that she is taking her thyroid medication regularly.  She wonders if her thyroid could be off. ? ?No dysuria.  She does wake up 2 times a night to urinate.  He has not noticed any swollen glands.  No night sweats or unusual fevers. ? ?No blood in the urine or stool.  She does have a history of renal cell carcinoma of the left kidney and says she just had her 1 year follow-up/checkup with Dr. Vernard Gambles.  She had a normal chest x-ray. ? ?She denies any recent chest pain.  She does note that she previously had a very stressful job she has now changed positions even though she is doing the same type of job but it is much less stressful so does not have a  good reason to feel more down or overwhelmed or irritable. ? ?History reviewed. No pertinent past medical history. ? ?History reviewed. No pertinent surgical history. ? ?History reviewed. No pertinent family history. ? ?Social History  ? ?Socioeconomic History  ? Marital status: Married  ?  Spouse name: Not on file  ? Number of children: Not on file  ? Years of education: Not on file  ? Highest education level: Not on file  ?Occupational History  ? Occupation: Therapist  ?Tobacco Use  ? Smoking status: Former  ?  Packs/day: 0.75  ?  Years: 30.00  ?  Pack years: 22.50  ?  Types: Cigarettes  ? Smokeless tobacco: Never  ?Vaping Use  ? Vaping Use: Never used  ?Substance and Sexual Activity  ? Alcohol use: Yes  ?  Alcohol/week: 1.0 - 2.0 standard drink  ?  Types: 1 - 2 Glasses of wine per week  ? Drug use: Not on file  ? Sexual activity: Yes  ?  Partners: Male  ?Other Topics Concern  ? Not on file  ?Social History Narrative  ? Not on file  ? ?Social Determinants of Health  ? ?Financial Resource Strain: Not on file  ?Food Insecurity: Not on file  ?Transportation Needs: Not on file  ?Physical  Activity: Not on file  ?Stress: Not on file  ?Social Connections: Not on file  ?Intimate Partner Violence: Not on file  ? ? ?Outpatient Medications Prior to Visit  ?Medication Sig Dispense Refill  ? atorvastatin (LIPITOR) 40 MG tablet Take 1 tablet (40 mg total) by mouth daily. 90 tablet 2  ? Cholecalciferol 50 MCG (2000 UT) TABS Take by mouth.    ? FLUoxetine (PROZAC) 40 MG capsule Take 1 capsule (40 mg total) by mouth daily. 90 capsule 2  ? levothyroxine (SYNTHROID) 50 MCG tablet Take 1 tablet (50 mcg total) by mouth daily before breakfast. 90 tablet 2  ? Multiple Minerals-Vitamins (DOLOMITE PLUS VITAMINS A AND D PO) vitamins A and D 10000 unit-400 unit capsule ? Take by oral route.    ? oxybutynin (DITROPAN) 5 MG tablet Take 1 tablet (5 mg total) by mouth 3 (three) times daily as needed. 270 tablet 1  ? ?No facility-administered  medications prior to visit.  ? ? ?Allergies  ?Allergen Reactions  ? Sulfa Antibiotics   ? Tetracyclines & Related   ? Codeine Nausea And Vomiting  ? Varenicline Other (See Comments)  ?  Bad dreams ?Bad dreams ?  ? ? ?Review of Systems ? ?   ?Objective:  ?  ?Physical Exam ?Constitutional:   ?   Appearance: She is well-developed.  ?HENT:  ?   Head: Normocephalic and atraumatic.  ?   Right Ear: External ear normal.  ?   Left Ear: External ear normal.  ?   Nose: Nose normal.  ?Eyes:  ?   Conjunctiva/sclera: Conjunctivae normal.  ?   Pupils: Pupils are equal, round, and reactive to light.  ?Neck:  ?   Thyroid: No thyromegaly.  ?Cardiovascular:  ?   Rate and Rhythm: Normal rate and regular rhythm.  ?   Heart sounds: Normal heart sounds.  ?Pulmonary:  ?   Effort: Pulmonary effort is normal.  ?   Breath sounds: Normal breath sounds. No wheezing.  ?Musculoskeletal:  ?   Cervical back: Neck supple.  ?Lymphadenopathy:  ?   Cervical: No cervical adenopathy.  ?Skin: ?   General: Skin is warm and dry.  ?Neurological:  ?   Mental Status: She is alert and oriented to person, place, and time.  ? ? ?BP (!) 113/54   Pulse 72   Ht _0  (1.626 m)   Wt 136 lb (61.7 kg)   SpO2 99%   BMI 23.34 kg/m?  ?Wt Readings from Last 3 Encounters:  ?03/02/22 136 lb (61.7 kg)  ?08/08/21 134 lb 11.2 oz (61.1 kg)  ?10/20/20 139 lb 6.4 oz (63.2 kg)  ? ? ?Health Maintenance Due  ?Topic Date Due  ? COVID-19 Vaccine (1) Never done  ? Hepatitis C Screening  Never done  ? TETANUS/TDAP  Never done  ? Zoster Vaccines- Shingrix (1 of 2) Never done  ? Fecal DNA (Cologuard)  Never done  ? MAMMOGRAM  Never done  ? Pneumonia Vaccine 32+ Years old (1 - PCV) Never done  ? DEXA SCAN  Never done  ? ? ?There are no preventive care reminders to display for this patient. ? ? ?Lab Results  ?Component Value Date  ? TSH 2.35 08/08/2021  ? ?Lab Results  ?Component Value Date  ? WBC 6.8 08/08/2021  ? HGB 11.7 08/08/2021  ? HCT 36.7 08/08/2021  ? MCV 89.1 08/08/2021  ? PLT  231 08/08/2021  ? ?Lab Results  ?Component Value Date  ? NA 140 08/08/2021  ?  K 4.2 08/08/2021  ? CO2 29 08/08/2021  ? GLUCOSE 85 08/08/2021  ? BUN 17 08/08/2021  ? CREATININE 0.96 08/08/2021  ? BILITOT 0.4 08/08/2021  ? AST 21 08/08/2021  ? ALT 17 08/08/2021  ? PROT 6.9 08/08/2021  ? CALCIUM 9.9 08/08/2021  ? EGFR 66 08/08/2021  ? ?Lab Results  ?Component Value Date  ? CHOL 177 08/08/2021  ? ?Lab Results  ?Component Value Date  ? HDL 56 08/08/2021  ? ?Lab Results  ?Component Value Date  ? LDLCALC 100 (H) 08/08/2021  ? ?Lab Results  ?Component Value Date  ? TRIG 113 08/08/2021  ? ?Lab Results  ?Component Value Date  ? CHOLHDL 3.2 08/08/2021  ? ?No results found for: HGBA1C ? ?   ?Assessment & Plan:  ? ?Problem List Items Addressed This Visit   ? ?  ? Endocrine  ? Acquired hypothyroidism  ?  Plan to recheck TSH and see if at goal. She is taking her medication regularly.   ?  ?  ? Relevant Orders  ? TSH  ? Hemoglobin A1c  ? CBC  ? COMPLETE METABOLIC PANEL WITH GFR  ? Urinalysis, Routine w reflex microscopic  ? B12  ? VITAMIN D 25 Hydroxy (Vit-D Deficiency, Fractures)  ? Vitamin B1  ? Vitamin B6  ?  ? Other  ? Depression  ?  Also struggling with some irritability, apathy and fatigue which could all be related.  Discussed the option of increasing her fluoxetine to 60 mg for at least a period of time maybe 2 to 3 months to see if it is helpful.  If she is stable at some point in she could always consider going back down to 40 mg we will get up-to-date labs first and then decide if we need to make an adjustment on the medication. ?  ?  ? ?Other Visit Diagnoses   ? ? Dizziness    -  Primary  ? Relevant Orders  ? TSH  ? Hemoglobin A1c  ? CBC  ? COMPLETE METABOLIC PANEL WITH GFR  ? Urinalysis, Routine w reflex microscopic  ? B12  ? VITAMIN D 25 Hydroxy (Vit-D Deficiency, Fractures)  ? Vitamin B1  ? Vitamin B6  ? Other fatigue      ? Relevant Orders  ? TSH  ? Hemoglobin A1c  ? CBC  ? COMPLETE METABOLIC PANEL WITH GFR  ?  Urinalysis, Routine w reflex microscopic  ? B12  ? VITAMIN D 25 Hydroxy (Vit-D Deficiency, Fractures)  ? Vitamin B1  ? Vitamin B6  ? Chronic nonintractable headache, unspecified headache type      ? Relevant Medi

## 2022-03-02 NOTE — Assessment & Plan Note (Signed)
Plan to recheck TSH and see if at goal. She is taking her medication regularly.   ?

## 2022-03-02 NOTE — Progress Notes (Deleted)
? ?Acute Office Visit ? ?Subjective:  ? ? Patient ID: Brenda Stanley, female    DOB: [birth year], 66 y.o.   MRN: [id number] ? ?No chief complaint on file. ? ? ?HPI ?Patient is in today for *** ? ?No past medical history on file. ? ?No past surgical history on file. ? ?No family history on file. ? ?Social History  ? ?Socioeconomic History  ? Marital status: Married  ?  Spouse name: Not on file  ? Number of children: Not on file  ? Years of education: Not on file  ? Highest education level: Not on file  ?Occupational History  ? Occupation: Therapist  ?Tobacco Use  ? Smoking status: Former  ?  Packs/day: 0.75  ?  Years: 30.00  ?  Pack years: 22.50  ?  Types: Cigarettes  ? Smokeless tobacco: Never  ?Vaping Use  ? Vaping Use: Never used  ?Substance and Sexual Activity  ? Alcohol use: Yes  ?  Alcohol/week: 1.0 - 2.0 standard drink  ?  Types: 1 - 2 Glasses of wine per week  ? Drug use: Not on file  ? Sexual activity: Yes  ?  Partners: Male  ?Other Topics Concern  ? Not on file  ?Social History Narrative  ? Not on file  ? ?Social Determinants of Health  ? ?Financial Resource Strain: Not on file  ?Food Insecurity: Not on file  ?Transportation Needs: Not on file  ?Physical Activity: Not on file  ?Stress: Not on file  ?Social Connections: Not on file  ?Intimate Partner Violence: Not on file  ? ? ?Outpatient Medications Prior to Visit  ?Medication Sig Dispense Refill  ? atorvastatin (LIPITOR) 40 MG tablet Take 1 tablet (40 mg total) by mouth daily. 90 tablet 2  ? Cholecalciferol 50 MCG (2000 UT) TABS Take by mouth.    ? FLUoxetine (PROZAC) 40 MG capsule Take 1 capsule (40 mg total) by mouth daily. 90 capsule 2  ? levothyroxine (SYNTHROID) 50 MCG tablet Take 1 tablet (50 mcg total) by mouth daily before breakfast. 90 tablet 2  ? Multiple Minerals-Vitamins (DOLOMITE PLUS VITAMINS A AND D PO) vitamins A and D 10000 unit-400 unit capsule ? Take by oral route.    ? oxybutynin (DITROPAN) 5 MG tablet Take 1 tablet (5 mg total) by mouth 3  (three) times daily as needed. 270 tablet 1  ? ?No facility-administered medications prior to visit.  ? ? ?Allergies  ?Allergen Reactions  ? Sulfa Antibiotics   ? Tetracyclines & Related   ? Codeine Nausea And Vomiting  ? Varenicline Other (See Comments)  ?  Bad dreams ?Bad dreams ?  ? ? ?Review of Systems ? ?   ?Objective:  ?  ?Physical Exam ? ?There were no vitals taken for this visit. ?Wt Readings from Last 3 Encounters:  ?08/08/21 134 lb 11.2 oz (61.1 kg)  ?10/20/20 139 lb 6.4 oz (63.2 kg)  ?08/30/20 135 lb (61.2 kg)  ? ? ?Health Maintenance Due  ?Topic Date Due  ? COVID-19 Vaccine (1) Never done  ? Hepatitis C Screening  Never done  ? TETANUS/TDAP  Never done  ? Zoster Vaccines- Shingrix (1 of 2) Never done  ? Fecal DNA (Cologuard)  Never done  ? MAMMOGRAM  Never done  ? Pneumonia Vaccine 26+ Years old (1 - PCV) Never done  ? DEXA SCAN  Never done  ? ? ?There are no preventive care reminders to display for this patient. ? ? ?Lab Results  ?Component Value  Date  ? TSH 2.35 08/08/2021  ? ?Lab Results  ?Component Value Date  ? WBC 6.8 08/08/2021  ? HGB 11.7 08/08/2021  ? HCT 36.7 08/08/2021  ? MCV 89.1 08/08/2021  ? PLT 231 08/08/2021  ? ?Lab Results  ?Component Value Date  ? NA 140 08/08/2021  ? K 4.2 08/08/2021  ? CO2 29 08/08/2021  ? GLUCOSE 85 08/08/2021  ? BUN 17 08/08/2021  ? CREATININE 0.96 08/08/2021  ? BILITOT 0.4 08/08/2021  ? AST 21 08/08/2021  ? ALT 17 08/08/2021  ? PROT 6.9 08/08/2021  ? CALCIUM 9.9 08/08/2021  ? EGFR 66 08/08/2021  ? ?Lab Results  ?Component Value Date  ? CHOL 177 08/08/2021  ? ?Lab Results  ?Component Value Date  ? HDL 56 08/08/2021  ? ?Lab Results  ?Component Value Date  ? LDLCALC 100 (H) 08/08/2021  ? ?Lab Results  ?Component Value Date  ? TRIG 113 08/08/2021  ? ?Lab Results  ?Component Value Date  ? CHOLHDL 3.2 08/08/2021  ? ?No results found for: HGBA1C ? ?   ?Assessment & Plan:  ? ?Problem List Items Addressed This Visit   ?None ? ? ? ?No orders of the defined types were placed  in this encounter. ? ? ? ?Beatrice Lecher, MD ? ?

## 2022-03-29 ENCOUNTER — Other Ambulatory Visit: Payer: Self-pay

## 2022-03-29 DIAGNOSIS — E7800 Pure hypercholesterolemia, unspecified: Secondary | ICD-10-CM

## 2022-03-29 DIAGNOSIS — F3341 Major depressive disorder, recurrent, in partial remission: Secondary | ICD-10-CM

## 2022-03-29 MED ORDER — ATORVASTATIN CALCIUM 40 MG PO TABS: 40.0000 mg | ORAL_TABLET | Freq: Every day | ORAL | 2 refills | Status: DC

## 2022-03-29 MED ORDER — FLUOXETINE HCL 40 MG PO CAPS: 40.0000 mg | ORAL_CAPSULE | Freq: Every day | ORAL | 2 refills | Status: DC

## 2022-10-30 ENCOUNTER — Other Ambulatory Visit: Payer: Self-pay | Admitting: Family Medicine

## 2022-10-30 DIAGNOSIS — E039 Hypothyroidism, unspecified: Secondary | ICD-10-CM

## 2023-03-02 ENCOUNTER — Other Ambulatory Visit: Payer: Self-pay | Admitting: Family Medicine

## 2023-03-02 DIAGNOSIS — E7800 Pure hypercholesterolemia, unspecified: Secondary | ICD-10-CM

## 2023-03-02 NOTE — Telephone Encounter (Signed)
Called spoke with patient she is scheduled for an appointment on Friday April 23rd .

## 2023-03-05 ENCOUNTER — Encounter: Payer: Self-pay | Admitting: *Deleted

## 2023-03-16 ENCOUNTER — Encounter: Payer: Self-pay | Admitting: Family Medicine

## 2023-03-16 ENCOUNTER — Ambulatory Visit (INDEPENDENT_AMBULATORY_CARE_PROVIDER_SITE_OTHER): Payer: Medicare Other | Admitting: Family Medicine

## 2023-03-16 VITALS — BP 123/75 | HR 72 | Ht 64.0 in | Wt 130.6 lb

## 2023-03-16 DIAGNOSIS — C642 Malignant neoplasm of left kidney, except renal pelvis: Secondary | ICD-10-CM | POA: Diagnosis not present

## 2023-03-16 DIAGNOSIS — Z87891 Personal history of nicotine dependence: Secondary | ICD-10-CM

## 2023-03-16 DIAGNOSIS — K130 Diseases of lips: Secondary | ICD-10-CM

## 2023-03-16 DIAGNOSIS — E039 Hypothyroidism, unspecified: Secondary | ICD-10-CM

## 2023-03-16 DIAGNOSIS — E559 Vitamin D deficiency, unspecified: Secondary | ICD-10-CM

## 2023-03-16 DIAGNOSIS — F3342 Major depressive disorder, recurrent, in full remission: Secondary | ICD-10-CM

## 2023-03-16 DIAGNOSIS — E7800 Pure hypercholesterolemia, unspecified: Secondary | ICD-10-CM | POA: Diagnosis not present

## 2023-03-16 DIAGNOSIS — Z1211 Encounter for screening for malignant neoplasm of colon: Secondary | ICD-10-CM

## 2023-03-16 DIAGNOSIS — Z1231 Encounter for screening mammogram for malignant neoplasm of breast: Secondary | ICD-10-CM

## 2023-03-16 DIAGNOSIS — R7309 Other abnormal glucose: Secondary | ICD-10-CM

## 2023-03-16 LAB — CBC WITH DIFFERENTIAL/PLATELET
Eosinophils Relative: 0.8 %
Lymphs Abs: 1452 cells/uL (ref 850–3900)
Platelets: 224 10*3/uL (ref 140–400)
Total Lymphocyte: 24.2 %
WBC: 6 10*3/uL (ref 3.8–10.8)

## 2023-03-16 LAB — LIPID PANEL W/REFLEX DIRECT LDL
HDL: 52 mg/dL (ref >=50)
Non-HDL Cholesterol (Calc): 131 mg/dL (calc) — ABNORMAL HIGH (ref <130)
Total CHOL/HDL Ratio: 3.5 (calc) (ref <5.0)
Triglycerides: 88 mg/dL (ref <150)

## 2023-03-16 LAB — COMPLETE METABOLIC PANEL WITH GFR
Alkaline phosphatase (APISO): 89 U/L (ref 37–153)
CO2: 29 mmol/L (ref 20–32)
Potassium: 4.4 mmol/L (ref 3.5–5.3)
Total Bilirubin: 0.6 mg/dL (ref 0.2–1.2)

## 2023-03-16 NOTE — Progress Notes (Signed)
[name] - 67 y.o. female MRN [id number]  Date of birth: 05/25/1956  Subjective Chief Complaint  Patient presents with  . Medication Refill    HPI Brenda Stanley is a 67 y.o. female here today for follow up visit.   History of RCC of the L kidney.  Doing well at this time.  Stable on CT on 01/08/23.   Remains on levothyroxine and is taking daily.  She is feeling fatigued.   Remains on atorvastatin for management of HLD.  She is tolerating this well.  Overdue for labs at this time.    Mood is stable with fluoxetine.    She is interested in lung cancer screening.  History of 30 pack year history of smoking and quit smoking around 5-6 years ago  ROS:  A comprehensive ROS was completed and negative except as noted per HPI  Allergies  Allergen Reactions  . Sulfa Antibiotics   . Tetracyclines & Related   . Codeine Nausea And Vomiting  . Varenicline Other (See Comments)    Bad dreams Bad dreams     History reviewed. No pertinent past medical history.  History reviewed. No pertinent surgical history.  Social History   Socioeconomic History  . Marital status: Married    Spouse name: Not on file  . Number of children: Not on file  . Years of education: Not on file  . Highest education level: Not on file  Occupational History  . Occupation: Therapist  Tobacco Use  . Smoking status: Former    Packs/day: 0.75    Years: 30.00    Additional pack years: 0.00    Total pack years: 22.50    Types: Cigarettes  . Smokeless tobacco: Never  Vaping Use  . Vaping Use: Never used  Substance and Sexual Activity  . Alcohol use: Yes    Alcohol/week: 1.0 - 2.0 standard drink of alcohol    Types: 1 - 2 Glasses of wine per week  . Drug use: Not on file  . Sexual activity: Yes    Partners: Male  Other Topics Concern  . Not on file  Social History Narrative  . Not on file   Social Determinants of Health   Financial Resource Strain: Not on file  Food Insecurity: Not on file   Transportation Needs: Not on file  Physical Activity: Not on file  Stress: Not on file  Social Connections: Not on file    History reviewed. No pertinent family history.  Health Maintenance  Topic Date Due  . COVID-19 Vaccine (1) Never done  . Hepatitis C Screening  Never done  . DTaP/Tdap/Td (1 - Tdap) Never done  . Fecal DNA (Cologuard)  Never done  . Lung Cancer Screening  Never done  . MAMMOGRAM  Never done  . DEXA SCAN  Never done  . Medicare Annual Wellness (AWV)  09/15/2023 (Originally 1956/09/14)  . Zoster Vaccines- Shingrix (1 of 2) 09/15/2023 (Originally 02/21/1975)  . Pneumonia Vaccine 42+ Years old (1 of 1 - PCV) 03/15/2024 (Originally 02/20/2021)  . INFLUENZA VACCINE  06/21/2023  . HPV VACCINES  Aged Out     ----------------------------------------------------------------------------------------------------------------------------------------------------------------------------------------------------------------- Physical Exam BP 123/75 (BP Location: Left Arm, Patient Position: Sitting, Cuff Size: Small)   Pulse 72   Ht 5\' 4"  (1.626 m)   Wt 130 lb 9.6 oz (59.2 kg)   SpO2 99%   BMI 22.42 kg/m   Physical Exam Constitutional:      Appearance: Normal appearance.  HENT:     Head:  Normocephalic and atraumatic.  Eyes:     General: No scleral icterus. Cardiovascular:     Rate and Rhythm: Normal rate and regular rhythm.  Pulmonary:     Effort: Pulmonary effort is normal.     Breath sounds: Normal breath sounds.  Musculoskeletal:     Cervical back: Neck supple.  Skin:    General: Skin is warm and dry.  Neurological:     General: No focal deficit present.     Mental Status: She is alert.    ------------------------------------------------------------------------------------------------------------------------------------------------------------------------------------------------------------------- Assessment and Plan  No problem-specific Assessment & Plan  notes found for this encounter.   No orders of the defined types were placed in this encounter.   No follow-ups on file.    This visit occurred during the SARS-CoV-2 public health emergency.  Safety protocols were in place, including screening questions prior to the visit, additional usage of staff PPE, and extensive cleaning of exam room while observing appropriate contact time as indicated for disinfecting solutions.

## 2023-03-16 NOTE — Assessment & Plan Note (Signed)
She is having some fatigue.  Update TSH

## 2023-03-16 NOTE — Assessment & Plan Note (Signed)
Update lipid panel.  

## 2023-03-16 NOTE — Assessment & Plan Note (Signed)
Stable scan in February.  Update renal function today.

## 2023-03-17 LAB — COMPLETE METABOLIC PANEL WITH GFR
AG Ratio: 2 (calc) (ref 1.0–2.5)
AST: 18 U/L (ref 10–35)
Calcium: 9.7 mg/dL (ref 8.6–10.4)
Creat: 1.02 mg/dL (ref 0.50–1.05)
Globulin: 2.1 g/dL (calc) (ref 1.9–3.7)
Total Protein: 6.4 g/dL (ref 6.1–8.1)

## 2023-03-17 LAB — VITAMIN B12: Vitamin B-12: 351 pg/mL (ref 200–1100)

## 2023-03-17 LAB — CBC WITH DIFFERENTIAL/PLATELET
Basophils Relative: 0.5 %
HCT: 37.4 % (ref 35.0–45.0)
Hemoglobin: 12.3 g/dL (ref 11.7–15.5)
MCHC: 32.9 g/dL (ref 32.0–36.0)
MCV: 88.4 fL (ref 80.0–100.0)
Neutro Abs: 4068 cells/uL (ref 1500–7800)
RBC: 4.23 10*6/uL (ref 3.80–5.10)

## 2023-03-17 LAB — HEMOGLOBIN A1C: Hgb A1c MFr Bld: 5.7 % of total Hgb — ABNORMAL HIGH (ref <5.7)

## 2023-03-17 LAB — LIPID PANEL W/REFLEX DIRECT LDL: Cholesterol: 183 mg/dL (ref <200)

## 2023-03-18 DIAGNOSIS — Z87891 Personal history of nicotine dependence: Secondary | ICD-10-CM | POA: Insufficient documentation

## 2023-03-18 DIAGNOSIS — K130 Diseases of lips: Secondary | ICD-10-CM | POA: Insufficient documentation

## 2023-03-18 NOTE — Assessment & Plan Note (Signed)
Stable fluoxetine at current strength.  Recommend continuation.

## 2023-03-18 NOTE — Assessment & Plan Note (Signed)
She would be interested in lung cancer screening.  Low-dose CT scan of the chest ordered.  We discussed that there may be additional findings on the scan including signs of coronary artery disease that we may need to follow-up on as well.

## 2023-03-18 NOTE — Assessment & Plan Note (Signed)
Recurrent in nature.  Checking B vitamins.

## 2023-03-19 LAB — COMPLETE METABOLIC PANEL WITH GFR
ALT: 14 U/L (ref 6–29)
Albumin: 4.3 g/dL (ref 3.6–5.1)
BUN: 14 mg/dL (ref 7–25)
Chloride: 104 mmol/L (ref 98–110)
Glucose, Bld: 74 mg/dL (ref 65–99)
Sodium: 140 mmol/L (ref 135–146)
eGFR: 60 mL/min/{1.73_m2} (ref >=60)

## 2023-03-19 LAB — TSH: TSH: 8.68 mIU/L — ABNORMAL HIGH (ref 0.40–4.50)

## 2023-03-19 LAB — LIPID PANEL W/REFLEX DIRECT LDL: LDL Cholesterol (Calc): 112 mg/dL (calc) — ABNORMAL HIGH

## 2023-03-19 LAB — HEMOGLOBIN A1C
Mean Plasma Glucose: 117 mg/dL
eAG (mmol/L): 6.5 mmol/L

## 2023-03-19 LAB — CBC WITH DIFFERENTIAL/PLATELET
Absolute Monocytes: 402 cells/uL (ref 200–950)
Basophils Absolute: 30 cells/uL (ref 0–200)
Eosinophils Absolute: 48 cells/uL (ref 15–500)
MCH: 29.1 pg (ref 27.0–33.0)
MPV: 10.6 fL (ref 7.5–12.5)
Monocytes Relative: 6.7 %
Neutrophils Relative %: 67.8 %
RDW: 12.8 % (ref 11.0–15.0)

## 2023-03-19 LAB — VITAMIN D 25 HYDROXY (VIT D DEFICIENCY, FRACTURES): Vit D, 25-Hydroxy: 48 ng/mL (ref 30–100)

## 2023-03-19 LAB — VITAMIN B6: Vitamin B6: 4.9 ng/mL (ref 2.1–21.7)

## 2023-03-30 ENCOUNTER — Other Ambulatory Visit: Payer: Self-pay | Admitting: Family Medicine

## 2023-03-30 DIAGNOSIS — E039 Hypothyroidism, unspecified: Secondary | ICD-10-CM

## 2023-03-30 MED ORDER — LEVOTHYROXINE SODIUM 75 MCG PO TABS
75.0000 ug | ORAL_TABLET | Freq: Every day | ORAL | 0 refills | Status: DC
Start: 2023-03-30 — End: 2023-04-12

## 2023-04-04 ENCOUNTER — Other Ambulatory Visit: Payer: Self-pay | Admitting: Family Medicine

## 2023-04-04 DIAGNOSIS — E7800 Pure hypercholesterolemia, unspecified: Secondary | ICD-10-CM

## 2023-04-04 DIAGNOSIS — F3341 Major depressive disorder, recurrent, in partial remission: Secondary | ICD-10-CM

## 2023-04-12 ENCOUNTER — Other Ambulatory Visit: Payer: Self-pay

## 2023-04-12 DIAGNOSIS — E039 Hypothyroidism, unspecified: Secondary | ICD-10-CM

## 2023-04-12 DIAGNOSIS — F3341 Major depressive disorder, recurrent, in partial remission: Secondary | ICD-10-CM

## 2023-04-12 DIAGNOSIS — E7800 Pure hypercholesterolemia, unspecified: Secondary | ICD-10-CM

## 2023-04-12 MED ORDER — ATORVASTATIN CALCIUM 40 MG PO TABS: 40.0000 mg | ORAL_TABLET | Freq: Every day | ORAL | 3 refills | Status: DC

## 2023-04-12 MED ORDER — LEVOTHYROXINE SODIUM 75 MCG PO TABS: 75.0000 ug | ORAL_TABLET | Freq: Every day | ORAL | 0 refills | Status: DC

## 2023-04-12 MED ORDER — FLUOXETINE HCL 40 MG PO CAPS: 40.0000 mg | ORAL_CAPSULE | Freq: Every day | ORAL | 0 refills | Status: DC

## 2023-04-25 ENCOUNTER — Encounter: Payer: Self-pay | Admitting: Family Medicine

## 2023-04-25 ENCOUNTER — Telehealth (INDEPENDENT_AMBULATORY_CARE_PROVIDER_SITE_OTHER): Payer: Medicare Other | Admitting: Family Medicine

## 2023-04-25 VITALS — Ht 64.0 in | Wt 128.0 lb

## 2023-04-25 DIAGNOSIS — R1011 Right upper quadrant pain: Secondary | ICD-10-CM

## 2023-04-25 DIAGNOSIS — R0781 Pleurodynia: Secondary | ICD-10-CM | POA: Diagnosis not present

## 2023-04-25 NOTE — Progress Notes (Signed)
Pain behind the rt lower rib cage closer to her back area. Started 4 days prior. Yesterday stopped having sharp pain. Using heating pad. Back pain has reduced also.

## 2023-04-25 NOTE — Progress Notes (Signed)
Brenda Stanley - 67 y.o. female MRN [id number]  Date of birth: 1956/10/16   This format is felt to be most appropriate for this patient at this time.  All issues noted in this document were discussed and addressed.  No physical exam was performed (except for noted visual exam findings with Video Visits).  I discussed the limitations of evaluation and management by telemedicine and the availability of in person appointments. The patient expressed understanding and agreed to proceed.  I connected withNAME@ on 04/25/23 at  4:10 PM EDT by a video enabled telemedicine application and verified that I am speaking with the correct person using two identifiers.  Present at visit: Everrett Coombe, DO [facility]   Patient Location: Home` [address] [place] [address]   Provider location:   [facility]  Chief Complaint  Patient presents with   Chest Pain    HPI  Brenda Stanley is a 67 y.o. female who presents via audio/video conferencing for a telehealth visit today.  She has complaint of pain along the lower posterior lateral ribs on the right side.  She does not recall any injury to this area.  Pain worse with trying to take a deep breath.  She did try laying on a heating pad for several hours and this did seem to improve things.  She has also tried Tylenol.  She has not had shortness of breath but sometimes the pain does catch and take her breath away.  She does note some symptoms of feeling full more easily with some occasional nausea.  She was noted to have gallstones on previous imaging.   ROS:  A comprehensive ROS was completed and negative except as noted per HPI  History reviewed. No pertinent past medical history.  History reviewed. No pertinent surgical history.  History reviewed. No pertinent family history.  Social History   Socioeconomic History   Marital status: Married    Spouse name: Not on file   Number of children: Not on file   Years of education: Not on file    Highest education level: Not on file  Occupational History   Occupation: Therapist  Tobacco Use   Smoking status: Former    Packs/day: 0.75    Years: 30.00    Additional pack years: 0.00    Total pack years: 22.50    Types: Cigarettes   Smokeless tobacco: Never  Vaping Use   Vaping Use: Never used  Substance and Sexual Activity   Alcohol use: Yes    Alcohol/week: 1.0 - 2.0 standard drink of alcohol    Types: 1 - 2 Glasses of wine per week   Drug use: Not on file   Sexual activity: Yes    Partners: Male  Other Topics Concern   Not on file  Social History Narrative   Not on file   Social Determinants of Health   Financial Resource Strain: Not on file  Food Insecurity: Not on file  Transportation Needs: Not on file  Physical Activity: Not on file  Stress: Not on file  Social Connections: Not on file  Intimate Partner Violence: Not on file     Current Outpatient Medications:    atorvastatin (LIPITOR) 40 MG tablet, Take 1 tablet (40 mg total) by mouth daily., Disp: 90 tablet, Rfl: 3   Cholecalciferol 50 MCG (2000 UT) TABS, Take by mouth., Disp: , Rfl:    FLUoxetine (PROZAC) 40 MG capsule, Take 1 capsule (40 mg total) by mouth daily., Disp: 90 capsule,  Rfl: 0   levothyroxine (SYNTHROID) 75 MCG tablet, Take 1 tablet (75 mcg total) by mouth daily before breakfast., Disp: 90 tablet, Rfl: 0   oxybutynin (DITROPAN) 5 MG tablet, Take 1 tablet (5 mg total) by mouth 3 (three) times daily as needed., Disp: 270 tablet, Rfl: 1  EXAM:  VITALS per patient if applicable: Ht 5\' 4"  (1.626 m)   Wt 128 lb (58.1 kg)   BMI 21.97 kg/m   GENERAL: alert, oriented, appears well and in no acute distress  HEENT: atraumatic, conjunttiva clear, no obvious abnormalities on inspection of external nose and ears  NECK: normal movements of the head and neck  LUNGS: on inspection no signs of respiratory distress, breathing rate appears normal, no obvious gross SOB, gasping or wheezing  CV: no  obvious cyanosis  MS: moves all visible extremities without noticeable abnormality  PSYCH/NEURO: pleasant and cooperative, no obvious depression or anxiety, speech and thought processing grossly intact  ASSESSMENT AND PLAN:  Discussed the following assessment and plan:  Rib pain This is a likely due to intercostal spasm and improved with heat and Tylenol.  She may continue this regimen as needed.  She does note some feeling of early fullness with history of gallstone.  Gallbladder disease would be in the differential for her pain.  Ultrasound ordered.     I discussed the assessment and treatment plan with the patient. The patient was provided an opportunity to ask questions and all were answered. The patient agreed with the plan and demonstrated an understanding of the instructions.   The patient was advised to call back or seek an in-person evaluation if the symptoms worsen or if the condition fails to improve as anticipated.    Everrett Coombe, DO

## 2023-04-25 NOTE — Assessment & Plan Note (Signed)
This is a likely due to intercostal spasm and improved with heat and Tylenol.  She may continue this regimen as needed.  She does note some feeling of early fullness with history of gallstone.  Gallbladder disease would be in the differential for her pain.  Ultrasound ordered.

## 2023-04-27 ENCOUNTER — Ambulatory Visit (INDEPENDENT_AMBULATORY_CARE_PROVIDER_SITE_OTHER): Payer: Medicare Other

## 2023-04-27 DIAGNOSIS — R1011 Right upper quadrant pain: Secondary | ICD-10-CM

## 2023-04-28 LAB — COLOGUARD: COLOGUARD: NEGATIVE

## 2023-05-01 ENCOUNTER — Encounter: Payer: Self-pay | Admitting: Family Medicine

## 2023-05-07 ENCOUNTER — Ambulatory Visit
Admission: RE | Admit: 2023-05-07 | Discharge: 2023-05-07 | Disposition: A | Discharge status: Home / Self Care | Payer: Medicare Other | Source: Ambulatory Visit | Attending: Urgent Care | Admitting: Urgent Care

## 2023-05-07 VITALS — BP 101/63 | HR 73 | Temp 97.8°F | Resp 18 | Ht 64.5 in | Wt 128.0 lb

## 2023-05-07 DIAGNOSIS — M7752 Other enthesopathy of left foot: Secondary | ICD-10-CM | POA: Diagnosis not present

## 2023-05-07 MED ORDER — PREDNISONE 10 MG (21) PO TBPK: ORAL_TABLET | Freq: Every day | ORAL | 0 refills | Status: DC

## 2023-05-07 NOTE — ED Provider Notes (Signed)
[facility]    CSN: [id number] Arrival date & time: 05/07/23  1353      History   Chief Complaint Chief Complaint  Patient presents with   Foot Pain    HPI Brenda Stanley is a 67 y.o. female.   Pleasant 67 year old female presents today due to concerns of left heel pain.  States it started mildly Saturday afternoon, worse upon awakening on Sunday morning.  She denies any injury or trauma to the area.  She reports she typically wears bobs shoes, but denies any irritation to the back of her heel.  Denies overuse.  She does state history of chronic back issues, recently been having trouble from a prior surgery and believes that she may be walking with an abnormal gait possibly exacerbating her left heel.  States it feels red, warm, and puffy.  Has been icing it with some relief and did try some Excedrin with mild relief. No hx of gout. Has no pain when heel is dangling, but has pain with both plantar and dorsiflexion of foot, with direct pressure to area or with weightbearing.   Foot Pain    History reviewed. No pertinent past medical history.  Patient Active Problem List   Diagnosis Date Noted   Rib pain 04/25/2023   Cheilitis 03/18/2023   Stopped smoking with greater than 30 pack year history 03/18/2023   Renal cell carcinoma of left kidney (HCC) 01/24/2021   History of lumbar spinal fusion 09/06/2020   Acute left-sided low back pain without sciatica 08/30/2020   Vitamin D deficiency 12/15/2016   Pure hypercholesterolemia 07/30/2015   OAB (overactive bladder) 07/23/2013   Acquired hypothyroidism 07/22/2013   Recurrent major depressive disorder, in full remission (HCC) 07/22/2013    History reviewed. No pertinent surgical history.  OB History   No obstetric history on file.      Home Medications    Prior to Admission medications   Medication Sig Start Date End Date Taking? Authorizing Provider  atorvastatin (LIPITOR) 40 MG tablet Take 1 tablet (40 mg  total) by mouth daily. 04/12/23  Yes Everrett Coombe, DO  Cholecalciferol 50 MCG (2000 UT) TABS Take by mouth. 04/18/18  Yes [provider]  FLUoxetine (PROZAC) 40 MG capsule Take 1 capsule (40 mg total) by mouth daily. 04/12/23  Yes Everrett Coombe, DO  levothyroxine (SYNTHROID) 75 MCG tablet Take 1 tablet (75 mcg total) by mouth daily before breakfast. 04/12/23  Yes Everrett Coombe, DO  oxybutynin (DITROPAN) 5 MG tablet Take 1 tablet (5 mg total) by mouth 3 (three) times daily as needed. 10/20/20  Yes Everrett Coombe, DO  predniSONE (STERAPRED UNI-PAK 21 TAB) 10 MG (21) TBPK tablet Take by mouth daily. Take 6 tabs by mouth daily  for 1 days, then 5 tabs for 1 days, then 4 tabs for 1 days, then 3 tabs for 1 days, 2 tabs for 1 days, then 1 tab by mouth daily for 1 days 05/07/23  Yes Lerlene Treadwell L, PA    Family History History reviewed. No pertinent family history.  Social History Social History   Tobacco Use   Smoking status: Former    Packs/day: 0.75    Years: 30.00    Additional pack years: 0.00    Total pack years: 22.50    Types: Cigarettes   Smokeless tobacco: Never  Vaping Use   Vaping Use: Never used  Substance Use Topics   Alcohol use: Yes    Alcohol/week: 1.0 - 2.0 standard drink of alcohol  Types: 1 - 2 Glasses of wine per week     Allergies   Sulfa antibiotics, Tetracyclines & related, Codeine, and Varenicline   Review of Systems Review of Systems As per HPI  Physical Exam Triage Vital Signs ED Triage Vitals  Enc Vitals Group     BP 05/07/23 1405 101/63     Pulse Rate 05/07/23 1405 73     Resp 05/07/23 1405 18     Temp 05/07/23 1405 97.8 F (36.6 C)     Temp Source 05/07/23 1405 Oral     SpO2 05/07/23 1405 96 %     Weight 05/07/23 1407 128 lb (58.1 kg)     Height 05/07/23 1407 5' 4.5" (1.638 m)     Head Circumference --      Peak Flow --      Pain Score 05/07/23 1407 5     Pain Loc --      Pain Edu? --      Excl. in GC? --    No data  found.  Updated Vital Signs BP 101/63 (BP Location: Left Arm)   Pulse 73   Temp 97.8 F (36.6 C) (Oral)   Resp 18   Ht 5' 4.5" (1.638 m)   Wt 128 lb (58.1 kg)   SpO2 96%   BMI 21.63 kg/m   Visual Acuity Right Eye Distance:   Left Eye Distance:   Bilateral Distance:    Right Eye Near:   Left Eye Near:    Bilateral Near:     Physical Exam Vitals and nursing note reviewed. Exam conducted with a chaperone present.  Constitutional:      General: She is not in acute distress.    Appearance: Normal appearance. She is normal weight. She is not ill-appearing, toxic-appearing or diaphoretic.  HENT:     Head: Normocephalic.  Eyes:     General: No scleral icterus.       Right eye: No discharge.        Left eye: No discharge.  Cardiovascular:     Rate and Rhythm: Normal rate.     Pulses: Normal pulses.  Pulmonary:     Effort: Pulmonary effort is normal. No respiratory distress.  Musculoskeletal:        General: Normal range of motion.     Right ankle: Normal.     Left ankle: No swelling, deformity, ecchymosis or lacerations. No tenderness. Normal range of motion. Anterior drawer test negative. Normal pulse.     Left Achilles Tendon: Tenderness (at insertion point on calcaneous) present. No defects. Thompson's test negative.     Right foot: Normal. Normal range of motion and normal capillary refill. No swelling, deformity, tenderness or bony tenderness.     Left foot: Normal. Normal range of motion and normal capillary refill. No swelling, deformity, tenderness or bony tenderness.       Feet:  Neurological:     Mental Status: She is alert.      UC Treatments / Results  Labs (all labs ordered are listed, but only abnormal results are displayed) Labs Reviewed - No data to display  EKG   Radiology No results found.  Procedures Procedures (including critical care time)  Medications Ordered in UC Medications - No data to display  Initial Impression / Assessment and  Plan / UC Course  I have reviewed the triage vital signs and the nursing notes.  Pertinent labs & imaging results that were available during my care of the patient were  reviewed by me and considered in my medical decision making (see chart for details).     L achilles bursitis - Will start pt on prednisone taper. Recommended icing the area as well. Pt to avoid direct pressure on the bursa to prevent continued irritation. Discussed additional possible workup to include labs and xray imaging, but will perform upon follow up if patient fails to respond to above tx plan. Gallstone - pt under care of PCP for this. She is currently asx, but is concerned. Discussed Actigall, recommended she discuss with PCP to see if she is a candidate for this.   Final Clinical Impressions(s) / UC Diagnoses   Final diagnoses:  Left calcaneal bursitis     Discharge Instructions      Your symptoms most likely related to bursitis, which is inflammation of a fluid-filled sac over your bone. Please continue to ice the area 3 times daily for the next 3 days. Please take the prednisone taper pack as prescribed, take with breakfast in the morning.  If your symptoms persist or change, return for recheck.  Please look up the medication Ursodial (actigall) and follow up with your PCP to discuss if this is a good option for your gallstones.     ED Prescriptions     Medication Sig Dispense Auth. Provider   predniSONE (STERAPRED UNI-PAK 21 TAB) 10 MG (21) TBPK tablet Take by mouth daily. Take 6 tabs by mouth daily  for 1 days, then 5 tabs for 1 days, then 4 tabs for 1 days, then 3 tabs for 1 days, 2 tabs for 1 days, then 1 tab by mouth daily for 1 days 21 tablet Tomaz Janis L, PA      PDMP not reviewed this encounter.   Maretta Bees, [place] 05/07/23 1439

## 2023-05-07 NOTE — Discharge Instructions (Addendum)
Your symptoms most likely related to bursitis, which is inflammation of a fluid-filled sac over your bone. Please continue to ice the area 3 times daily for the next 3 days. Please take the prednisone taper pack as prescribed, take with breakfast in the morning.  If your symptoms persist or change, return for recheck.  Please look up the medication Ursodial (actigall) and follow up with your PCP to discuss if this is a good option for your gallstones.

## 2023-05-07 NOTE — ED Triage Notes (Signed)
Patient states that she woke up Saturday evening with some left foot pain.  No apparent injury.  Patient has taken Excedrin and applied ice, no relief.  No history of gout.

## 2023-05-08 ENCOUNTER — Ambulatory Visit: Payer: Medicare Other

## 2023-06-20 ENCOUNTER — Ambulatory Visit: Payer: Medicare Other

## 2023-07-02 ENCOUNTER — Other Ambulatory Visit: Payer: Self-pay | Admitting: Family Medicine

## 2023-07-02 DIAGNOSIS — E039 Hypothyroidism, unspecified: Secondary | ICD-10-CM

## 2023-07-02 DIAGNOSIS — F3341 Major depressive disorder, recurrent, in partial remission: Secondary | ICD-10-CM

## 2024-03-31 ENCOUNTER — Ambulatory Visit: Admitting: Family Medicine

## 2024-03-31 ENCOUNTER — Other Ambulatory Visit: Payer: Self-pay | Admitting: Family Medicine

## 2024-03-31 ENCOUNTER — Telehealth: Payer: Self-pay | Admitting: Family Medicine

## 2024-03-31 DIAGNOSIS — E039 Hypothyroidism, unspecified: Secondary | ICD-10-CM

## 2024-03-31 MED ORDER — LEVOTHYROXINE SODIUM 75 MCG PO TABS
75.0000 ug | ORAL_TABLET | Freq: Every day | ORAL | 0 refills | Status: DC
Start: 2024-03-31 — End: 2024-04-18

## 2024-03-31 NOTE — Telephone Encounter (Signed)
 Sent in a refill. Patient is scheduled to see Dr Augustus Ledger on Friday.

## 2024-03-31 NOTE — Telephone Encounter (Signed)
 Medication sent to pharmacy, duplicate encounter.

## 2024-03-31 NOTE — Telephone Encounter (Signed)
 Copied from CRM [id number]. Topic: Clinical - Medication Refill >> Mar 31, 2024 11:51 AM Tiffany H wrote: Medication: levothyroxine  (SYNTHROID ) 75 MCG tablet [[id number]]  Has the patient contacted their pharmacy? Yes (Agent: If no, request that the patient contact the pharmacy for the refill. If patient does not wish to contact the pharmacy document the reason why and proceed with request.) (Agent: If yes, when and what did the pharmacy advise?)  This is the patient's preferred pharmacy:  [facility] [phone] -   [facility] - [place], [place] - [address] [place] [address] Phone: [phone] Fax: [phone]  Is this the correct pharmacy for this prescription? Yes If no, delete pharmacy and type the correct one.   Has the prescription been filled recently? Yes  Is the patient out of the medication? Yes  Has the patient been seen for an appointment in the last year OR does the patient have an upcoming appointment? Yes  Can we respond through MyChart? No  Agent: Please be advised that Rx refills may take up to 3 business days. We ask that you follow-up with your pharmacy.  Dr. Augustus Ledger cancelled today's visit. Patient is due for thyroid labs for refill. Please process refill, as [facility] takes a week to process new orders. Please expedite. Patient has been rescheduled for Friday at 9:30AM.

## 2024-04-04 ENCOUNTER — Ambulatory Visit (INDEPENDENT_AMBULATORY_CARE_PROVIDER_SITE_OTHER): Admitting: Family Medicine

## 2024-04-04 ENCOUNTER — Encounter: Payer: Self-pay | Admitting: Family Medicine

## 2024-04-04 VITALS — BP 134/71 | HR 66 | Ht 64.5 in | Wt 118.0 lb

## 2024-04-04 DIAGNOSIS — E559 Vitamin D deficiency, unspecified: Secondary | ICD-10-CM | POA: Diagnosis not present

## 2024-04-04 DIAGNOSIS — R4189 Other symptoms and signs involving cognitive functions and awareness: Secondary | ICD-10-CM | POA: Insufficient documentation

## 2024-04-04 DIAGNOSIS — E039 Hypothyroidism, unspecified: Secondary | ICD-10-CM | POA: Diagnosis not present

## 2024-04-04 DIAGNOSIS — E7800 Pure hypercholesterolemia, unspecified: Secondary | ICD-10-CM

## 2024-04-04 DIAGNOSIS — F3342 Major depressive disorder, recurrent, in full remission: Secondary | ICD-10-CM

## 2024-04-04 DIAGNOSIS — Z87891 Personal history of nicotine dependence: Secondary | ICD-10-CM

## 2024-04-04 DIAGNOSIS — Z1231 Encounter for screening mammogram for malignant neoplasm of breast: Secondary | ICD-10-CM

## 2024-04-04 DIAGNOSIS — Z780 Asymptomatic menopausal state: Secondary | ICD-10-CM | POA: Diagnosis not present

## 2024-04-04 DIAGNOSIS — M720 Palmar fascial fibromatosis [Dupuytren]: Secondary | ICD-10-CM | POA: Insufficient documentation

## 2024-04-04 NOTE — Assessment & Plan Note (Signed)
 Checking TSH and B12.  Referral to neurology.

## 2024-04-04 NOTE — Assessment & Plan Note (Signed)
 Referral placed to hand surgery to discuss options for management

## 2024-04-04 NOTE — Assessment & Plan Note (Signed)
She would be interested in lung cancer screening.  Low-dose CT scan of the chest ordered.  We discussed that there may be additional findings on the scan including signs of coronary artery disease that we may need to follow-up on as well.

## 2024-04-04 NOTE — Assessment & Plan Note (Signed)
Stable fluoxetine at current strength.  Recommend continuation.

## 2024-04-04 NOTE — Patient Instructions (Signed)
Dupuytren's Contracture Dupuytren's contracture can be a hard condition to deal with. Your health care provider and those close to you can help you manage it. When you have this condition, the tissue under the skin of your palm gets thick. This causes one or more of your fingers to curl inward (contract) toward the palm. After a while, the fingers may not be able to straighten out. This condition can affect some or all of your fingers and the palms of both hands. Dupuytren's contracture is a long-term (chronic) condition that develops (progresses) slowly over time. While there is no cure yet, symptoms can be managed and treatment can slow it down. This condition is usually not dangerous but it can interfere with your everyday tasks. What are the causes?  This condition is caused by tissue (fascia) in your palm that gets thicker and tighter. When the tissue thickens, it pulls on the cords of your tissue (tendons) that control finger movement. This causes the fingers to contract. The cause of your tissue getting thick is not known. However, the condition is often passed along from parent to child (inherited). What increases the risk? Being 58 years of age or older. Being female. Having a family history of this condition. Using tobacco products, including cigarettes, chewing tobacco, and e-cigarettes. Drinking too much alcohol. Having diabetes. Having a seizure disorder. What are the signs or symptoms? Early symptoms of this condition may include: Thick, wrinkled skin on the hand. One or more lumps (nodules) on the palm. Lumps may sometimes feel tender or painful. Later symptoms of this condition may include: Thick cords of tissue in the palm. Fingers curled up toward the palm. Inability to straighten the fingers into their usual position. Discomfort when holding or grabbing objects. How is this diagnosed? This condition is diagnosed with a physical exam. This may include: Looking at your hands  and feeling your palms. This is to check for thickened tissue and lumps. Measuring finger motion. Doing the Hueston tabletop test. This is where you may try to put your hand on a surface, with your palm down and your fingers straight out. How is this treated? There is no cure for this condition, but treatment can relieve your discomfort and make symptoms more manageable. Treatment options may include: Physical therapy. This can strengthen your hand and increase flexibility. Occupational therapy. This can help you with everyday tasks that may have become more difficult. Shots (injections). Substances may be injected into your hand, such as: Medicines that help to decrease swelling and pain (corticosteroids). Enzymes (collagenase) to weaken thick tissue. After a collagenase injection, your provider may stretch your fingers. Needle aponeurotomy. A needle is pushed through the skin and into the tissue. Moving the needle against the tissues can weaken or break up the thick tissue. Surgery. This may be needed if your condition causes discomfort or interferes with everyday activities. Physical therapy is usually needed after surgery. No treatment is guaranteed to cure this condition. It is common for the condition to come back. Follow these instructions at home: Hand care Take these actions to help protect your hand from possible injury: Use tools that have padded grips. Wear protective gloves while you work with your hands. Avoid repeated hand movements. General instructions Take over-the-counter and prescription medicines only as told by your provider. Manage any other conditions that you have, such as diabetes. If physical therapy was prescribed, do exercises as told by your provider. Do not use any products that contain nicotine or tobacco. These products  include cigarettes, chewing tobacco, and vaping devices, such as e-cigarettes. If you need help quitting, ask your provider. If you drink  alcohol: Limit how much you have to: 0-1 drink a day if you are female. 0-2 drinks a day if you are female. Know how much alcohol is in your drink. In the [place] one drink is one 12 oz bottle of beer (355 mL), one 5 oz glass of wine (148 mL), or one 1 oz glass of hard liquor (44 mL). Keep all follow-up visits. Your provider will check to see if your treatments need adjusting. Where to find support Dupuytren Research Group: dupuytrens.org International Dupuytren Society: dupuytren-online.info Contact a health care provider if: You develop new symptoms or your symptoms get worse. You have pain that gets worse or does not get better with medicine. You have difficulty or discomfort with everyday tasks. You develop numbness or tingling. Get help right away if: You have severe pain. Your fingers change color or become unusually cold. This information is not intended to replace advice given to you by your health care provider. Make sure you discuss any questions you have with your health care provider. Document Revised: 11/27/2022 Document Reviewed: 10/18/2022 Elsevier Patient Education  2024 [employer].

## 2024-04-04 NOTE — Progress Notes (Signed)
 Nahomy Garber - 68 y.o. female MRN [id number]  Date of birth: [birth year]  Subjective Chief Complaint  Patient presents with   Hand Pain    HPI Ayat Krisch is a 68 y.o. female here today for follow up visit.   She has hitory of hypothyroidism and is taking levothyroxine  daily for treatment of this.  Reports that she feels pretty good at current strength.   Mood is stable with fluoxetine  at current strength.  No side effects at this time. Admits to some caregiver stress related to caring for her MIL that hast alzheimers.  She feels like she has noted some cognitive change as well and would like to see neurology.   She has some firm nodule and cords in the L hand.  Some pain at times with this.   Tolerating atorvastatin  well for management of HLD.   ROS:  A comprehensive ROS was completed and negative except as noted per HPI  Allergies  Allergen Reactions   Sulfa Antibiotics    Tetracyclines & Related    Codeine Nausea And Vomiting   Varenicline Other (See Comments)    Bad dreams Bad dreams     History reviewed. No pertinent past medical history.  History reviewed. No pertinent surgical history.  Social History   Socioeconomic History   Marital status: Married    Spouse name: Not on file   Number of children: Not on file   Years of education: Not on file   Highest education level: Not on file  Occupational History   Occupation: Therapist  Tobacco Use   Smoking status: Former    Current packs/day: 0.75    Average packs/day: 0.8 packs/day for 30.0 years (22.5 ttl pk-yrs)    Types: Cigarettes   Smokeless tobacco: Never  Vaping Use   Vaping status: Never Used  Substance and Sexual Activity   Alcohol use: Yes    Alcohol/week: 1.0 - 2.0 standard drink of alcohol    Types: 1 - 2 Glasses of wine per week   Drug use: Not on file   Sexual activity: Yes    Partners: Male  Other Topics Concern   Not on file  Social History Narrative   Not on file   Social Drivers of  Health   Financial Resource Strain: Low Risk  (01/25/2021)   Received from [facility], [facility]   Overall Financial Resource Strain (CARDIA)    Difficulty of Paying Living Expenses: Not hard at all  Food Insecurity: No Food Insecurity (04/04/2024)   Hunger Vital Sign    Worried About Running Out of Food in the Last Year: Never true    Ran Out of Food in the Last Year: Never true  Transportation Needs: No Transportation Needs (04/04/2024)   PRAPARE - Administrator, Civil Service (Medical): No    Lack of Transportation (Non-Medical): No  Physical Activity: Not on file  Stress: Stress Concern Present (01/25/2021)   Received from [facility], [facility] Occupational [facility] - Occupational Stress Questionnaire    Feeling of Stress : To some extent  Social Connections: Unknown (04/02/2022)   Received from [facility], [facility]   Social Network    Social Network: Not on file    History reviewed. No pertinent family history.  Health Maintenance  Topic Date Due   Medicare Annual Wellness (AWV)  Never done   Hepatitis C Screening  Never done   DTaP/Tdap/Td (1 - Tdap) Never done  Zoster Vaccines- Shingrix (1 of 2) Never done   Lung Cancer Screening  Never done   Pneumonia Vaccine 22+ Years old (1 of 1 - PCV) Never done   MAMMOGRAM  Never done   DEXA SCAN  Never done   COVID-19 Vaccine (1) 04/20/2025 (Originally 02/20/1961)   INFLUENZA VACCINE  06/20/2024   Fecal DNA (Cologuard)  04/22/2026   HPV VACCINES  Aged Out   Meningococcal B Vaccine  Aged Out     ----------------------------------------------------------------------------------------------------------------------------------------------------------------------------------------------------------------- Physical Exam BP 134/71 (BP Location: Left Arm, Patient Position: Sitting, Cuff Size: Small)   Pulse 66   Ht 5' 4.5" (1.638 m)   Wt 118 lb (53.5 kg)   SpO2 100%   BMI 19.94  kg/m   Physical Exam Constitutional:      Appearance: Normal appearance.  HENT:     Head: Normocephalic and atraumatic.  Cardiovascular:     Rate and Rhythm: Normal rate and regular rhythm.  Pulmonary:     Effort: Pulmonary effort is normal.     Breath sounds: Normal breath sounds.  Musculoskeletal:     Comments: L Hand: Palmar nodule with small cords forming across the palm.  Slight contracture.   Neurological:     Mental Status: She is alert.     ------------------------------------------------------------------------------------------------------------------------------------------------------------------------------------------------------------------- Assessment and Plan  Pure hypercholesterolemia Update lipid panel.   Acquired hypothyroidism Taking levothyroxine  daily.  Update TSH  Cognitive change Checking TSH and B12.  Referral to neurology.   Recurrent major depressive disorder, in full remission (HCC) Stable fluoxetine  at current strength.  Recommend continuation.  Stopped smoking with greater than 30 pack year history She would be interested in lung cancer screening.  Low-dose CT scan of the chest ordered.  We discussed that there may be additional findings on the scan including signs of coronary artery disease that we may need to follow-up on as well.  Dupuytren contracture of left hand Referral placed to hand surgery to discuss options for management    No orders of the defined types were placed in this encounter.   No follow-ups on file.

## 2024-04-04 NOTE — Progress Notes (Signed)
 Advised pt of TDAP & Shingles vaccine. Pt to request at pharmacy.

## 2024-04-04 NOTE — Assessment & Plan Note (Signed)
 Update lipid panel.

## 2024-04-04 NOTE — Assessment & Plan Note (Signed)
 Taking levothyroxine  daily.  Update TSH

## 2024-04-05 LAB — CMP14+EGFR
ALT: 24 IU/L (ref 0–32)
AST: 32 IU/L (ref 0–40)
Albumin: 4.2 g/dL (ref 3.9–4.9)
Alkaline Phosphatase: 123 IU/L — ABNORMAL HIGH (ref 44–121)
BUN/Creatinine Ratio: 20 (ref 12–28)
BUN: 17 mg/dL (ref 8–27)
Bilirubin Total: 0.2 mg/dL (ref 0.0–1.2)
CO2: 20 mmol/L (ref 20–29)
Calcium: 9.9 mg/dL (ref 8.7–10.3)
Chloride: 104 mmol/L (ref 96–106)
Creatinine, Ser: 0.83 mg/dL (ref 0.57–1.00)
Globulin, Total: 2.2 g/dL (ref 1.5–4.5)
Glucose: 72 mg/dL (ref 70–99)
Potassium: 4.4 mmol/L (ref 3.5–5.2)
Sodium: 141 mmol/L (ref 134–144)
Total Protein: 6.4 g/dL (ref 6.0–8.5)
eGFR: 77 mL/min/{1.73_m2} (ref >59)

## 2024-04-05 LAB — CBC WITH DIFFERENTIAL/PLATELET
Basophils Absolute: 0 10*3/uL (ref 0.0–0.2)
Basos: 0 %
EOS (ABSOLUTE): 0.2 10*3/uL (ref 0.0–0.4)
Eos: 3 %
Hematocrit: 36.3 % (ref 34.0–46.6)
Hemoglobin: 11 g/dL — ABNORMAL LOW (ref 11.1–15.9)
Immature Grans (Abs): 0 10*3/uL (ref 0.0–0.1)
Immature Granulocytes: 0 %
Lymphocytes Absolute: 1.4 10*3/uL (ref 0.7–3.1)
Lymphs: 24 %
MCH: 27.4 pg (ref 26.6–33.0)
MCHC: 30.3 g/dL — ABNORMAL LOW (ref 31.5–35.7)
MCV: 90 fL (ref 79–97)
Monocytes Absolute: 0.5 10*3/uL (ref 0.1–0.9)
Monocytes: 8 %
Neutrophils Absolute: 3.8 10*3/uL (ref 1.4–7.0)
Neutrophils: 65 %
Platelets: 239 10*3/uL (ref 150–450)
RBC: 4.02 x10E6/uL (ref 3.77–5.28)
RDW: 13.3 % (ref 11.7–15.4)
WBC: 5.8 10*3/uL (ref 3.4–10.8)

## 2024-04-05 LAB — VITAMIN B12: Vitamin B-12: 379 pg/mL (ref 232–1245)

## 2024-04-05 LAB — LIPID PANEL WITH LDL/HDL RATIO
Cholesterol, Total: 166 mg/dL (ref 100–199)
HDL: 50 mg/dL (ref >39)
LDL Chol Calc (NIH): 97 mg/dL (ref 0–99)
LDL/HDL Ratio: 1.9 ratio (ref 0.0–3.2)
Triglycerides: 107 mg/dL (ref 0–149)
VLDL Cholesterol Cal: 19 mg/dL (ref 5–40)

## 2024-04-05 LAB — TSH: TSH: 0.016 u[IU]/mL — ABNORMAL LOW (ref 0.450–4.500)

## 2024-04-05 LAB — VITAMIN D 25 HYDROXY (VIT D DEFICIENCY, FRACTURES): Vit D, 25-Hydroxy: 35.6 ng/mL (ref 30.0–100.0)

## 2024-04-18 ENCOUNTER — Ambulatory Visit: Payer: Self-pay | Admitting: Family Medicine

## 2024-04-18 DIAGNOSIS — D649 Anemia, unspecified: Secondary | ICD-10-CM

## 2024-04-18 DIAGNOSIS — E039 Hypothyroidism, unspecified: Secondary | ICD-10-CM

## 2024-04-18 MED ORDER — LEVOTHYROXINE SODIUM 50 MCG PO TABS: 50.0000 ug | ORAL_TABLET | Freq: Every day | ORAL | 0 refills | Status: DC

## 2024-04-23 ENCOUNTER — Ambulatory Visit

## 2024-04-23 DIAGNOSIS — Z122 Encounter for screening for malignant neoplasm of respiratory organs: Secondary | ICD-10-CM | POA: Diagnosis not present

## 2024-04-23 DIAGNOSIS — Z1231 Encounter for screening mammogram for malignant neoplasm of breast: Secondary | ICD-10-CM

## 2024-04-23 DIAGNOSIS — Z87891 Personal history of nicotine dependence: Secondary | ICD-10-CM

## 2024-04-23 DIAGNOSIS — Z780 Asymptomatic menopausal state: Secondary | ICD-10-CM

## 2024-04-23 DIAGNOSIS — Z13820 Encounter for screening for osteoporosis: Secondary | ICD-10-CM

## 2024-05-09 ENCOUNTER — Other Ambulatory Visit: Payer: Self-pay | Admitting: Family Medicine

## 2024-05-09 DIAGNOSIS — F3341 Major depressive disorder, recurrent, in partial remission: Secondary | ICD-10-CM

## 2024-05-09 DIAGNOSIS — E7800 Pure hypercholesterolemia, unspecified: Secondary | ICD-10-CM

## 2024-05-09 DIAGNOSIS — E039 Hypothyroidism, unspecified: Secondary | ICD-10-CM

## 2024-05-09 MED ORDER — FLUOXETINE HCL 40 MG PO CAPS
40.0000 mg | ORAL_CAPSULE | Freq: Every day | ORAL | 3 refills | Status: AC
Start: 2024-05-09 — End: ?

## 2024-05-09 MED ORDER — LEVOTHYROXINE SODIUM 50 MCG PO TABS
50.0000 ug | ORAL_TABLET | Freq: Every day | ORAL | 3 refills | Status: AC
Start: 2024-05-09 — End: ?

## 2024-05-09 MED ORDER — ATORVASTATIN CALCIUM 40 MG PO TABS
40.0000 mg | ORAL_TABLET | Freq: Every day | ORAL | 3 refills | Status: AC
Start: 2024-05-09 — End: ?

## 2024-05-09 NOTE — Telephone Encounter (Signed)
 Copied from CRM [id number]. Topic: Clinical - Medication Refill >> May 09, 2024 11:14 AM Ary Bitter R wrote: Medication:  FLUoxetine  (PROZAC ) 40 MG capsule atorvastatin  (LIPITOR) 40 MG tablet levothyroxine  (SYNTHROID ) 50 MCG tablet  Has the patient contacted their pharmacy? Yes, [facility] 3 medications  This is the patient's preferred pharmacy:  [facility] - [place], [place] - [address] [place] [address] Phone: [phone] Fax: [phone]  Is this the correct pharmacy for this prescription? Yes  Has the prescription been filled recently? Yes  Is the patient out of the medication? Yes  Has the patient been seen for an appointment in the last year OR does the patient have an upcoming appointment? Yes  Can we respond through MyChart? Yes  Agent: Please be advised that Rx refills may take up to 3 business days. We ask that you follow-up with your pharmacy.

## 2024-07-24 ENCOUNTER — Encounter: Payer: Self-pay | Admitting: Neurology

## 2024-07-24 ENCOUNTER — Telehealth: Payer: Self-pay | Admitting: Neurology

## 2024-07-24 ENCOUNTER — Ambulatory Visit: Admitting: Neurology

## 2024-07-24 VITALS — BP 132/72 | HR 90 | Ht 63.0 in | Wt 115.0 lb

## 2024-07-24 DIAGNOSIS — G3184 Mild cognitive impairment, so stated: Secondary | ICD-10-CM | POA: Diagnosis not present

## 2024-07-24 DIAGNOSIS — R413 Other amnesia: Secondary | ICD-10-CM

## 2024-07-24 NOTE — Patient Instructions (Addendum)
 Start B12 supplement, 1000 mcg daily Head CT without contrast  Continue your other medications Continue to follow-up with PCP Discussed ways of decreasing the progression of memory loss including keeping a good health, good diet, good exercise plan.   Continue follow-up PCP  Return as needed.   There are well-accepted and sensible ways to reduce risk for Alzheimers disease and other degenerative brain disorders .  Exercise Daily Walk A daily 20 minute walk should be part of your routine. Disease related apathy can be a significant roadblock to exercise and the only way to overcome this is to make it a daily routine and perhaps have a reward at the end (something your loved one loves to eat or drink perhaps) or a personal trainer coming to the home can also be very useful. Most importantly, the patient is much more likely to exercise if the caregiver / spouse does it with him/her. In general a structured, repetitive schedule is best.  General Health: Any diseases which effect your body will effect your brain such as a pneumonia, urinary infection, blood clot, heart attack or stroke. Keep contact with your primary care doctor for regular follow ups.  Sleep. A good nights sleep is healthy for the brain. Seven hours is recommended. If you have insomnia or poor sleep habits we can give you some instructions. If you have sleep apnea wear your mask.  Diet: Eating a heart healthy diet is also a good idea; fish and poultry instead of red meat, nuts (mostly non-peanuts), vegetables, fruits, olive oil or canola oil (instead of butter), minimal salt (use other spices to flavor foods), whole grain rice, bread, cereal and pasta and wine in moderation.Research is now showing that the MIND diet, which is a combination of The Mediterranean diet and the DASH diet, is beneficial for cognitive processing and longevity. Information about this diet can be found in The MIND Diet, a book by Annitta Feeling, MS, RDN, and online  at WildWildScience.es  Finances, Power of [address] and Advance Directives: You should consider putting legal safeguards in place with regard to financial and medical decision making. While the spouse always has power of attorney for medical and financial issues in the absence of any form, you should consider what you want in case the spouse / caregiver is no longer around or capable of making decisions.
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
## 2024-07-24 NOTE — Telephone Encounter (Signed)
 no auth required sent to [facility] [phone]

## 2024-08-04 ENCOUNTER — Ambulatory Visit
Admission: RE | Admit: 2024-08-04 | Discharge: 2024-08-04 | Disposition: A | Discharge status: Home / Self Care | Source: Ambulatory Visit | Attending: Neurology

## 2024-08-04 DIAGNOSIS — G3184 Mild cognitive impairment, so stated: Secondary | ICD-10-CM | POA: Diagnosis not present

## 2024-08-04 DIAGNOSIS — R413 Other amnesia: Secondary | ICD-10-CM

## 2024-08-06 ENCOUNTER — Encounter: Payer: Self-pay | Admitting: Family Medicine

## 2024-08-06 ENCOUNTER — Ambulatory Visit (INDEPENDENT_AMBULATORY_CARE_PROVIDER_SITE_OTHER): Admitting: Family Medicine

## 2024-08-06 VITALS — BP 115/71 | HR 63 | Ht 63.0 in | Wt 115.4 lb

## 2024-08-06 DIAGNOSIS — Z0000 Encounter for general adult medical examination without abnormal findings: Secondary | ICD-10-CM

## 2024-08-06 DIAGNOSIS — M5412 Radiculopathy, cervical region: Secondary | ICD-10-CM | POA: Insufficient documentation

## 2024-08-06 DIAGNOSIS — M5416 Radiculopathy, lumbar region: Secondary | ICD-10-CM | POA: Insufficient documentation

## 2024-08-06 NOTE — Patient Instructions (Signed)
 Fall Prevention in the Home, Adult Falls can cause injuries and can happen to people of all ages. There are many things you can do to make your home safer and to help prevent falls. What actions can I take to prevent falls? General information Use good lighting in all rooms. Make sure to: Replace any light bulbs that burn out. Turn on the lights in dark areas and use night-lights. Keep items that you use often in easy-to-reach places. Lower the shelves around your home if needed. Move furniture so that there are clear paths around it. Do not use throw rugs or other things on the floor that can make you trip. If any of your floors are uneven, fix them. Add color or contrast paint or tape to clearly mark and help you see: Grab bars or handrails. First and last steps of staircases. Where the edge of each step is. If you use a ladder or stepladder: Make sure that it is fully opened. Do not climb a closed ladder. Make sure the sides of the ladder are locked in place. Have someone hold the ladder while you use it. Know where your pets are as you move through your home. What can I do in the bathroom?     Keep the floor dry. Clean up any water on the floor right away. Remove soap buildup in the bathtub or shower. Buildup makes bathtubs and showers slippery. Use non-skid mats or decals on the floor of the bathtub or shower. Attach bath mats securely with double-sided, non-slip rug tape. If you need to sit down in the shower, use a non-slip stool. Install grab bars by the toilet and in the bathtub and shower. Do not use towel bars as grab bars. What can I do in the bedroom? Make sure that you have a light by your bed that is easy to reach. Do not use any sheets or blankets on your bed that hang to the floor. Have a firm chair or bench with side arms that you can use for support when you get dressed. What can I do in the kitchen? Clean up any spills right away. If you need to reach something  above you, use a step stool with a grab bar. Keep electrical cords out of the way. Do not use floor polish or wax that makes floors slippery. What can I do with my stairs? Do not leave anything on the stairs. Make sure that you have a light switch at the top and the bottom of the stairs. Make sure that there are handrails on both sides of the stairs. Fix handrails that are broken or loose. Install non-slip stair treads on all your stairs if they do not have carpet. Avoid having throw rugs at the top or bottom of the stairs. Choose a carpet that does not hide the edge of the steps on the stairs. Make sure that the carpet is firmly attached to the stairs. Fix carpet that is loose or worn. What can I do on the outside of my home? Use bright outdoor lighting. Fix the edges of walkways and driveways and fix any cracks. Clear paths of anything that can make you trip, such as tools or rocks. Add color or contrast paint or tape to clearly mark and help you see anything that might make you trip as you walk through a door, such as a raised step or threshold. Trim any bushes or trees on paths to your home. Check to see if handrails are loose  or broken and that both sides of all steps have handrails. Install guardrails along the edges of any raised decks and porches. Have leaves, snow, or ice cleared regularly. Use sand, salt, or ice melter on paths if you live where there is ice and snow during the winter. Clean up any spills in your garage right away. This includes grease or oil spills. What other actions can I take? Review your medicines with your doctor. Some medicines can cause dizziness or changes in blood pressure, which increase your risk of falling. Wear shoes that: Have a low heel. Do not wear high heels. Have rubber bottoms and are closed at the toe. Feel good on your feet and fit well. Use tools that help you move around if needed. These include: Canes. Walkers. Scooters. Crutches. Ask  your doctor what else you can do to help prevent falls. This may include seeing a physical therapist to learn to do exercises to move better and get stronger. Where to find more information Centers for Disease Control and Prevention, STEADI: TonerPromos.no [employer] on Aging: BaseRingTones.pl National Institute on Aging: BaseRingTones.pl Contact a doctor if: You are afraid of falling at home. You feel weak, drowsy, or dizzy at home. You fall at home. Get help right away if you: Lose consciousness or have trouble moving after a fall. Have a fall that causes a head injury. These symptoms may be an emergency. Get help right away. Call 911. Do not wait to see if the symptoms will go away. Do not drive yourself to the hospital. This information is not intended to replace advice given to you by your health care provider. Make sure you discuss any questions you have with your health care provider. Document Revised: 07/10/2022 Document Reviewed: 07/10/2022 Elsevier Patient Education  2024 Elsevier Inc. Hearing Loss Hearing loss is a partial or total loss of the ability to hear. This can be temporary or permanent, and it can happen in one or both ears. There are two types of hearing loss. You can have just one type or both types. You may have a problem with: Damage to your hearing nerves (sensorineural hearing loss). This type of hearing loss is more likely to be permanent. A hearing aid is often the best treatment. Sound getting to your inner ear (conductive hearing loss). This type of hearing loss can usually be treated medically or surgically. Medical care is necessary to treat hearing loss properly and to prevent the condition from getting worse. Your hearing may partially or completely come back, depending on what caused your hearing loss and how severe it is. In some cases, hearing loss is permanent. What are the causes? Common causes of hearing loss include: Too much wax in the ear canal. Infection  of the ear canal or middle ear. Fluid in the middle ear. Injury to the ear or surrounding area. An object stuck in the ear. A history of prolonged exposure to loud sounds, such as music. Less common causes of hearing loss include: Tumors in the ear. Viral or bacterial infections, such as meningitis. A hole in the eardrum (perforated eardrum). Problems with the hearing nerve that sends signals between the brain and the ear. Certain medicines. What are the signs or symptoms? Symptoms of this condition may include: Difficulty telling the difference between sounds. Difficulty following a conversation when there is background noise. Lack of response to sounds in your environment. This may be most noticeable when you do not respond to startling sounds. Needing to turn up  the volume on the television, radio, or other devices. Ringing in the ears. Dizziness. How is this diagnosed? This condition is diagnosed based on: A physical exam. A hearing test (audiometry). The test will be performed by a hearing specialist (audiologist). Imaging tests, such as an MRI or CT scan. You may also be referred to an ear, nose, and throat (ENT) specialist (otolaryngologist). How is this treated? Treatment for hearing loss may include: Earwax removal. Medicines to treat or prevent infection (antibiotics). Medicines to reduce inflammation (corticosteroids). Hearing aids or assistive listening devices. Follow these instructions at home: If you were prescribed an antibiotic medicine, take it as told by your health care provider. Do not stop taking the antibiotic even if you start to feel better. Take over-the-counter and prescription medicines only as told by your health care provider. Avoid loud noises. Use hearing protection if you are exposed to loud noises or work in a noisy environment. Return to your normal activities as told by your health care provider. Ask your health care provider what activities are  safe for you. Keep all follow-up visits. This is important. Contact a health care provider if: You feel dizzy. You develop new symptoms. You vomit or feel nauseous. You have a fever. Get help right away if: You develop sudden changes in your vision. You have severe ear pain. You have new or increased weakness. You have a severe headache. Summary Hearing loss is a decreased ability to hear sounds around you. It can be temporary or permanent. Treatment will depend on the cause of your hearing loss. It may include earwax removal, medicines, or a hearing aid. Your hearing may partially or completely come back, depending on what caused your hearing loss and how severe it is. Keep all follow-up visits. This is important. This information is not intended to replace advice given to you by your health care provider. Make sure you discuss any questions you have with your health care provider. Document Revised: 09/15/2021 Document Reviewed: 09/15/2021 Elsevier Patient Education  2024 [employer].

## 2024-08-06 NOTE — Progress Notes (Signed)
 Subjective:   Brenda Stanley is a 68 y.o. female who presents for an Initial Medicare Annual Wellness Visit.  Visit Complete: In person  Patient Medicare AWV questionnaire was completed by the patient on 08/06/24; I have confirmed that all information answered by patient is correct and no changes since this date.        Objective:    Today's Vitals   08/06/24 1616  BP: 115/71  Pulse: 63  SpO2: 100%  Weight: 115 lb 6.4 oz (52.3 kg)  Height: 5' 3 (1.6 m)   Body mass index is 20.44 kg/m.     10/20/2020   10:02 AM  Advanced Directives  Does Patient Have a Medical Advance Directive? No  Would patient like information on creating a medical advance directive? No - Patient declined    Current Medications (verified) Outpatient Encounter Medications as of 08/06/2024  Medication Sig   atorvastatin  (LIPITOR) 40 MG tablet Take 1 tablet (40 mg total) by mouth daily.   FLUoxetine  (PROZAC ) 40 MG capsule Take 1 capsule (40 mg total) by mouth daily.   levothyroxine  (SYNTHROID ) 50 MCG tablet Take 1 tablet (50 mcg total) by mouth daily before breakfast.   No facility-administered encounter medications on file as of 08/06/2024.    Allergies (verified) Sulfa antibiotics, Tetracyclines & related, Codeine, and Varenicline   History: History reviewed. No pertinent past medical history. History reviewed. No pertinent surgical history. History reviewed. No pertinent family history. Social History   Socioeconomic History   Marital status: Married    Spouse name: Not on file   Number of children: Not on file   Years of education: Not on file   Highest education level: Not on file  Occupational History   Occupation: Therapist  Tobacco Use   Smoking status: Former    Current packs/day: 0.75    Average packs/day: 0.8 packs/day for 30.0 years (22.5 ttl pk-yrs)    Types: Cigarettes   Smokeless tobacco: Never  Vaping Use   Vaping status: Never Used  Substance and Sexual Activity    Alcohol use: Yes    Alcohol/week: 1.0 - 2.0 standard drink of alcohol    Types: 1 - 2 Glasses of wine per week   Drug use: Not on file   Sexual activity: Yes    Partners: Male  Other Topics Concern   Not on file  Social History Narrative   Right handed   Social Drivers of Health   Financial Resource Strain: Low Risk  (08/06/2024)   Overall Financial Resource Strain (CARDIA)    Difficulty of Paying Living Expenses: Not hard at all  Food Insecurity: No Food Insecurity (04/04/2024)   Hunger Vital Sign    Worried About Running Out of Food in the Last Year: Never true    Ran Out of Food in the Last Year: Never true  Transportation Needs: No Transportation Needs (04/04/2024)   PRAPARE - Administrator, Civil Service (Medical): No    Lack of Transportation (Non-Medical): No  Physical Activity: Sufficiently Active (08/06/2024)   Exercise Vital Sign    Days of Exercise per Week: 5 days    Minutes of Exercise per Session: 60 min  Stress: No Stress Concern Present (08/06/2024)   [employer] of Occupational Health - Occupational Stress Questionnaire    Feeling of Stress: Not at all  Social Connections: Moderately Isolated (08/06/2024)   Social Connection and Isolation Panel    Frequency of Communication with Friends and Family: Once a week  Frequency of Social Gatherings with Friends and Family: Once a week    Attends Religious Services: 1 to 4 times per year    Active Member of [employer] or Organizations: No    Attends Banker Meetings: Never    Marital Status: Married    Tobacco Counseling Counseling given: Not Answered   Clinical Intake:  68 yo female.  No new concerns today.  Doing well with chronic medications.  She is UTD on recommended screenings.   Evaluated by neurology recently for some changes to her memory.  Dx with [facility].  Additional work up ongoing.       Activities of Daily Living    08/06/2024    4:07 PM  In your present state of health, do  you have any difficulty performing the following activities:  Hearing? 1  Vision? 0  Difficulty concentrating or making decisions? 0  Walking or climbing stairs? 0  Dressing or bathing? 0  Doing errands, shopping? 0    Patient Care Team: Alvia Bring, DO as PCP - General (Family Medicine)  Indicate any recent Medical Services you may have received from other than [facility] providers in the past year (date may be approximate).     Assessment:   This is a routine wellness examination for [facility].  Hearing/Vision screen Hearing Screening   500Hz  1000Hz  2000Hz  3000Hz  4000Hz   Right ear Fail 40 40 40 40  Left ear Fail 40 40 40 40   Vision Screening   Right eye Left eye Both eyes  Without correction 20/50 20/40 20/40   With correction        Goals Addressed   None    Depression Screen    08/06/2024    4:19 PM 08/06/2024    4:06 PM 04/04/2024    9:52 AM 03/16/2023    9:09 AM 03/02/2022    4:27 PM 08/08/2021    4:09 PM 10/20/2020   10:01 AM  PHQ 2/9 Scores  PHQ - 2 Score 0 0 0 2 4 0 3  PHQ- 9 Score 0  2 4 8  0 11    Fall Risk    08/06/2024    4:18 PM 04/04/2024    9:51 AM 03/16/2023    8:55 AM 03/02/2022    4:27 PM 08/08/2021    4:09 PM  Fall Risk   Falls in the past year? 0 0 0 0 0  Number falls in past yr: 0 0 0 0 0  Injury with Fall? 0 0 0 0 0  Risk for fall due to : No Fall Risks No Fall Risks No Fall Risks No Fall Risks No Fall Risks  Follow up Falls evaluation completed Falls evaluation completed Follow up appointment;Falls evaluation completed Falls prevention discussed  Falls evaluation completed      Data saved with a previous flowsheet row definition    MEDICARE RISK AT HOME:    TIMED UP AND GO:  Was the test performed? No    Cognitive Function:      07/24/2024   10:40 AM  Montreal Cognitive Assessment   Visuospatial/ Executive (0/5) 4  Naming (0/3) 3  Attention: Read list of digits (0/2) 2  Attention: Read list of letters (0/1) 1  Attention: Serial 7  subtraction starting at 100 (0/3) 0  Language: Repeat phrase (0/2) 2  Language : Fluency (0/1) 1  Abstraction (0/2) 2  Delayed Recall (0/5) 3  Orientation (0/6) 6  Total 24      08/06/2024  4:18 PM  6CIT Screen  What Year? 0 points  What month? 0 points  What time? 0 points  Count back from 20 0 points  Months in reverse 0 points  Repeat phrase 0 points  Total Score 0 points    Immunizations Immunization History  Administered Date(s) Administered   Influenza,inj,Quad PF,6+ Mos 10/25/2015   PPD Test 01/09/2020    TDAP status: Due, Education has been provided regarding the importance of this vaccine. Advised may receive this vaccine at local pharmacy or Health Dept. Aware to provide a copy of the vaccination record if obtained from local pharmacy or Health Dept. Verbalized acceptance and understanding.  Flu Vaccine status: Due, Education has been provided regarding the importance of this vaccine. Advised may receive this vaccine at local pharmacy or Health Dept. Aware to provide a copy of the vaccination record if obtained from local pharmacy or Health Dept. Verbalized acceptance and understanding.  Pneumococcal vaccine status: Declined,  Education has been provided regarding the importance of this vaccine but patient still declined. Advised may receive this vaccine at local pharmacy or Health Dept. Aware to provide a copy of the vaccination record if obtained from local pharmacy or Health Dept. Verbalized acceptance and understanding.   Covid-19 vaccine status: Declined, Education has been provided regarding the importance of this vaccine but patient still declined. Advised may receive this vaccine at local pharmacy or Health Dept.or vaccine clinic. Aware to provide a copy of the vaccination record if obtained from local pharmacy or Health Dept. Verbalized acceptance and understanding.  Qualifies for Shingles Vaccine? Yes   Zostavax completed No   Shingrix Completed?: No.     Education has been provided regarding the importance of this vaccine. Patient has been advised to call insurance company to determine out of pocket expense if they have not yet received this vaccine. Advised may also receive vaccine at local pharmacy or Health Dept. Verbalized acceptance and understanding.  Screening Tests Health Maintenance  Topic Date Due   Zoster Vaccines- Shingrix (1 of 2) 11/05/2024 (Originally 02/21/1975)   Influenza Vaccine  02/17/2025 (Originally 06/20/2024)   COVID-19 Vaccine (1) 04/20/2025 (Originally 02/20/1961)   DTaP/Tdap/Td (1 - Tdap) 08/06/2025 (Originally 02/21/1975)   Pneumococcal Vaccine: 50+ Years (1 of 1 - PCV) 08/06/2025 (Originally 02/20/2006)   Hepatitis C Screening  08/06/2025 (Originally 02/20/1974)   Lung Cancer Screening  04/23/2025   Medicare Annual Wellness (AWV)  08/06/2025   Fecal DNA (Cologuard)  04/22/2026   DEXA SCAN  Completed   HPV VACCINES  Aged Out   Meningococcal B Vaccine  Aged Out   Mammogram  Discontinued    Health Maintenance  There are no preventive care reminders to display for this patient.   Colorectal cancer screening: Type of screening: Cologuard. Completed 04/23/2023. Repeat every 3 years  Mammogram status: Completed 04/23/24. Repeat every year  Bone Density status: Completed 04/23/24. Results reflect: Bone density results: OSTEOPENIA. Repeat every 2 years.  Lung Cancer Screening: (Low Dose CT Chest recommended if Age 100-80 years, 20 pack-year currently smoking OR have quit w/in 15years.) Completed on 04/23/24  Lung Cancer Screening Referral: n/a  Additional Screening:  Hepatitis C Screening: Declines  Vision Screening: Recommended annual ophthalmology exams for early detection of glaucoma and other disorders of the eye. Is the patient up to date with their annual eye exam?  Yes    Dental Screening: Recommended annual dental exams for proper oral hygiene  Diabetic Foot Exam: n/a  Community Resource Referral / Chronic Care  Management: CRR required this visit?  No   CCM required this visit?  No     Plan:     I have personally reviewed and noted the following in the patient's chart:   Medical and social history Use of alcohol, tobacco or illicit drugs  Current medications and supplements including opioid prescriptions. Patient is not currently taking opioid prescriptions. Functional ability and status Nutritional status Physical activity Advanced directives List of other physicians Hospitalizations, surgeries, and ER visits in previous 12 months Vitals Screenings to include cognitive, depression, and falls Referrals and appointments  In addition, I have reviewed and discussed with patient certain preventive protocols, quality metrics, and best practice recommendations. A written personalized care plan for preventive services as well as general preventive health recommendations were provided to patient.     Velma Ku, DO   08/06/2024   After Visit Summary: (In Person-Printed) AVS printed and given to the patient

## 2024-08-07 ENCOUNTER — Ambulatory Visit: Payer: Self-pay | Admitting: Neurology

## 2024-08-08 ENCOUNTER — Telehealth: Payer: Self-pay

## 2024-08-08 DIAGNOSIS — R1011 Right upper quadrant pain: Secondary | ICD-10-CM

## 2024-08-08 NOTE — Telephone Encounter (Signed)
 Copied from CRM [id number]. Topic: Clinical - Request for Lab/Test Order >> Aug 08, 2024  8:46 AM Marda MATSU wrote: Patient said she discussed with provider Alvia getting an US  of the Gallbladder. What is the status on getting that she asked.  Please advise

## 2024-08-11 ENCOUNTER — Telehealth: Payer: Self-pay

## 2024-08-11 NOTE — Telephone Encounter (Signed)
 Copied from CRM [id number]. Topic: Clinical - Request for Lab/Test Order >> Aug 08, 2024  3:20 PM Brenda Stanley ORN wrote: Reason for CRM: patient received the message on mychart about the ultrasound order That someone will be contacting her to schedule

## 2024-08-11 NOTE — Telephone Encounter (Signed)
 Patient is noting that she received the message that the ultrasound was ordered and someone would be reaching out to her for scheduling

## 2024-08-12 ENCOUNTER — Ambulatory Visit (INDEPENDENT_AMBULATORY_CARE_PROVIDER_SITE_OTHER)

## 2024-08-12 DIAGNOSIS — R1011 Right upper quadrant pain: Secondary | ICD-10-CM | POA: Diagnosis not present

## 2024-08-13 ENCOUNTER — Ambulatory Visit: Payer: Self-pay | Admitting: Family Medicine

## 2024-08-13 DIAGNOSIS — Q445 Other congenital malformations of bile ducts: Secondary | ICD-10-CM

## 2024-08-16 LAB — HEPATIC FUNCTION PANEL
ALT: 18 IU/L (ref 0–32)
AST: 23 IU/L (ref 0–40)
Albumin: 3.9 g/dL (ref 3.9–4.9)
Alkaline Phosphatase: 106 IU/L (ref 49–135)
Bilirubin Total: 0.3 mg/dL (ref 0.0–1.2)
Bilirubin, Direct: 0.1 mg/dL (ref 0.00–0.40)
Total Protein: 6 g/dL (ref 6.0–8.5)

## 2024-08-28 ENCOUNTER — Other Ambulatory Visit: Payer: Self-pay | Admitting: Family Medicine

## 2024-08-28 DIAGNOSIS — K8080 Other cholelithiasis without obstruction: Secondary | ICD-10-CM

## 2024-09-08 ENCOUNTER — Telehealth: Payer: Self-pay | Admitting: Family Medicine

## 2024-09-08 NOTE — Telephone Encounter (Signed)
 Copied from CRM #[id number]. Topic: Referral - Question >> Sep 08, 2024  8:58 AM Rosaria A wrote: Reason for CRM: Patient states that she has a referral in [facility] for her Gallbladder to be removed but she needs to be referred to a place in [place].
